# Patient Record
Sex: Female | Born: 1975 | Race: Black or African American | Hispanic: No | Marital: Single | Smoking: Never smoker
Health system: Southern US, Community
[De-identification: ages and names within clinical notes are randomized; demographics above are authoritative.]

## PROBLEM LIST (undated history)

## (undated) ENCOUNTER — Emergency Department (HOSPITAL_COMMUNITY): Admission: EM | Payer: BC Managed Care – PPO

## (undated) ENCOUNTER — Emergency Department (HOSPITAL_COMMUNITY): Admission: EM | Payer: MEDICAID | Source: Home / Self Care

## (undated) DIAGNOSIS — O24419 Gestational diabetes mellitus in pregnancy, unspecified control: Secondary | ICD-10-CM

## (undated) DIAGNOSIS — B379 Candidiasis, unspecified: Secondary | ICD-10-CM

## (undated) DIAGNOSIS — D219 Benign neoplasm of connective and other soft tissue, unspecified: Secondary | ICD-10-CM

## (undated) HISTORY — PX: ABDOMINAL HYSTERECTOMY: SHX81

---

## 1999-04-07 ENCOUNTER — Encounter: Admission: RE | Admit: 1999-04-07 | Discharge: 1999-04-07 | Payer: Self-pay | Admitting: Family Medicine

## 1999-04-20 ENCOUNTER — Encounter: Admission: RE | Admit: 1999-04-20 | Discharge: 1999-04-20 | Payer: Self-pay | Admitting: Family Medicine

## 1999-04-28 ENCOUNTER — Encounter: Payer: Self-pay | Admitting: Emergency Medicine

## 1999-04-28 ENCOUNTER — Emergency Department (HOSPITAL_COMMUNITY): Admission: EM | Admit: 1999-04-28 | Discharge: 1999-04-28 | Payer: Self-pay | Admitting: Emergency Medicine

## 1999-05-17 ENCOUNTER — Encounter: Admission: RE | Admit: 1999-05-17 | Discharge: 1999-05-17 | Payer: Self-pay | Admitting: Family Medicine

## 1999-06-28 ENCOUNTER — Encounter: Admission: RE | Admit: 1999-06-28 | Discharge: 1999-06-28 | Payer: Self-pay | Admitting: Family Medicine

## 1999-07-19 ENCOUNTER — Encounter: Admission: RE | Admit: 1999-07-19 | Discharge: 1999-07-19 | Payer: Self-pay | Admitting: Family Medicine

## 1999-08-23 ENCOUNTER — Encounter: Admission: RE | Admit: 1999-08-23 | Discharge: 1999-08-23 | Payer: Self-pay | Admitting: Family Medicine

## 1999-08-23 ENCOUNTER — Other Ambulatory Visit: Admission: RE | Admit: 1999-08-23 | Discharge: 1999-08-23 | Payer: Self-pay | Admitting: Family Medicine

## 1999-08-30 ENCOUNTER — Encounter: Admission: RE | Admit: 1999-08-30 | Discharge: 1999-08-30 | Payer: Self-pay | Admitting: Family Medicine

## 2000-03-28 ENCOUNTER — Emergency Department (HOSPITAL_COMMUNITY): Admission: EM | Admit: 2000-03-28 | Discharge: 2000-03-28 | Payer: Self-pay | Admitting: Emergency Medicine

## 2001-06-24 ENCOUNTER — Other Ambulatory Visit: Admission: RE | Admit: 2001-06-24 | Discharge: 2001-06-24 | Payer: Self-pay | Admitting: Obstetrics and Gynecology

## 2001-06-27 ENCOUNTER — Inpatient Hospital Stay (HOSPITAL_COMMUNITY): Admission: AD | Admit: 2001-06-27 | Discharge: 2001-06-27 | Payer: Self-pay | Admitting: Obstetrics and Gynecology

## 2001-12-02 ENCOUNTER — Inpatient Hospital Stay (HOSPITAL_COMMUNITY): Admission: AD | Admit: 2001-12-02 | Discharge: 2001-12-04 | Payer: Self-pay | Admitting: *Deleted

## 2001-12-05 ENCOUNTER — Encounter: Admission: RE | Admit: 2001-12-05 | Discharge: 2002-01-04 | Payer: Self-pay | Admitting: Obstetrics and Gynecology

## 2002-01-05 ENCOUNTER — Encounter: Admission: RE | Admit: 2002-01-05 | Discharge: 2002-02-04 | Payer: Self-pay | Admitting: Obstetrics and Gynecology

## 2002-01-21 ENCOUNTER — Other Ambulatory Visit: Admission: RE | Admit: 2002-01-21 | Discharge: 2002-01-21 | Payer: Self-pay | Admitting: Obstetrics and Gynecology

## 2002-03-05 ENCOUNTER — Encounter: Admission: RE | Admit: 2002-03-05 | Discharge: 2002-04-04 | Payer: Self-pay | Admitting: Obstetrics and Gynecology

## 2002-05-05 ENCOUNTER — Encounter: Admission: RE | Admit: 2002-05-05 | Discharge: 2002-06-04 | Payer: Self-pay | Admitting: Obstetrics and Gynecology

## 2002-07-05 ENCOUNTER — Encounter: Admission: RE | Admit: 2002-07-05 | Discharge: 2002-08-04 | Payer: Self-pay | Admitting: Obstetrics and Gynecology

## 2002-08-05 ENCOUNTER — Encounter: Admission: RE | Admit: 2002-08-05 | Discharge: 2002-09-04 | Payer: Self-pay | Admitting: Obstetrics and Gynecology

## 2002-10-05 ENCOUNTER — Encounter: Admission: RE | Admit: 2002-10-05 | Discharge: 2002-11-04 | Payer: Self-pay | Admitting: Obstetrics and Gynecology

## 2002-12-05 ENCOUNTER — Encounter: Admission: RE | Admit: 2002-12-05 | Discharge: 2003-01-04 | Payer: Self-pay | Admitting: Obstetrics and Gynecology

## 2003-01-05 ENCOUNTER — Encounter: Admission: RE | Admit: 2003-01-05 | Discharge: 2003-02-04 | Payer: Self-pay | Admitting: Obstetrics and Gynecology

## 2003-03-06 ENCOUNTER — Encounter: Admission: RE | Admit: 2003-03-06 | Discharge: 2003-04-05 | Payer: Self-pay | Admitting: *Deleted

## 2006-10-08 ENCOUNTER — Emergency Department (HOSPITAL_COMMUNITY): Admission: EM | Admit: 2006-10-08 | Discharge: 2006-10-08 | Payer: Self-pay | Admitting: Emergency Medicine

## 2008-07-23 ENCOUNTER — Ambulatory Visit: Payer: Self-pay | Admitting: Obstetrics and Gynecology

## 2008-07-24 ENCOUNTER — Ambulatory Visit (HOSPITAL_COMMUNITY): Admission: RE | Admit: 2008-07-24 | Discharge: 2008-07-24 | Payer: Self-pay | Admitting: Family Medicine

## 2008-08-06 ENCOUNTER — Ambulatory Visit: Payer: Self-pay | Admitting: Obstetrics & Gynecology

## 2008-09-02 ENCOUNTER — Ambulatory Visit: Payer: Self-pay | Admitting: Vascular Surgery

## 2008-09-22 ENCOUNTER — Inpatient Hospital Stay (HOSPITAL_COMMUNITY): Admission: RE | Admit: 2008-09-22 | Discharge: 2008-09-25 | Payer: Self-pay | Admitting: Obstetrics & Gynecology

## 2008-09-22 ENCOUNTER — Ambulatory Visit: Payer: Self-pay | Admitting: Obstetrics & Gynecology

## 2008-09-22 ENCOUNTER — Encounter: Payer: Self-pay | Admitting: Obstetrics & Gynecology

## 2008-10-05 ENCOUNTER — Ambulatory Visit: Payer: Self-pay | Admitting: Obstetrics & Gynecology

## 2008-10-05 ENCOUNTER — Inpatient Hospital Stay (HOSPITAL_COMMUNITY): Admission: AD | Admit: 2008-10-05 | Discharge: 2008-10-05 | Payer: Self-pay | Admitting: Obstetrics & Gynecology

## 2008-10-05 ENCOUNTER — Ambulatory Visit: Payer: Self-pay | Admitting: Obstetrics and Gynecology

## 2009-02-24 ENCOUNTER — Emergency Department (HOSPITAL_COMMUNITY): Admission: EM | Admit: 2009-02-24 | Discharge: 2009-02-24 | Payer: Self-pay | Admitting: Emergency Medicine

## 2011-03-09 LAB — POCT URINALYSIS DIP (DEVICE)
Protein, ur: 100 mg/dL — AB
Specific Gravity, Urine: 1.02 (ref 1.005–1.030)

## 2011-03-09 LAB — POCT PREGNANCY, URINE: Preg Test, Ur: NEGATIVE

## 2011-04-11 NOTE — Discharge Summary (Signed)
NAMEADLER, ALTON               ACCOUNT NO.:  [id number]   MEDICAL RECORD NO.:  [id number]          PATIENT TYPE:  INP   LOCATION:  9310                          FACILITY:  [facility]   PHYSICIAN:  Allie Bossier, MD        DATE OF BIRTH:  07-10-1976   DATE OF ADMISSION:  09/22/2008  DATE OF DISCHARGE:  09/25/2008                               DISCHARGE SUMMARY   DISPOSITION:  Home.   DIET:  As tolerated.   ACTIVITY:  Nothing per vagina for 6 weeks.  No driving a car while on  narcotics.   CONDITION:  Stable.   PROCEDURE:  Total abdominal hysterectomy.   TRANSFUSION:  Six units of packed blood cells.   ADMISSION DIAGNOSES:  Fibroids and anemia.   DISCHARGE DIAGNOSES:  Fibroids and anemia.   HISTORY OF PRESENT ILLNESS AND HOSPITAL COURSE:  Terri Gibson is a 35 year old  married, black, gravida 3, para 2, abortus 1, who has been followed in  the [facility] Clinic.  She has been having anemia and pica (she eats 1-2 boxes  a day of starch along with bread, dirt).  Her hemoglobin had finally  reached a peak of 7.5 in the clinic and ultrasound showed multiple  fibroids with her uterus having a vertical dimension of 14.5 cm.  There  were several fibroids that were each 5 cm.  She wished to have a  hysterectomy done.  Based on her preop hemoglobin done at the time in  clinic, I have ordered 4 units of blood to be typed and crossed with her  surgery.  The preop hemoglobin the day before surgery was 9.2, I am not  sure of the accuracy of the hemoglobin in light of a hemoglobin was 7.5  not in the near recent past.  In any event, her surgery was  uncomplicated, although she did lose a liter of blood because of the  large fibroids and the need to dissect them out.  Transfusion of 2 units  of packed cells in the recovery room.  The following morning, her  hemoglobin was 6.6 and she received a total of 4 more units over the  next 2 days.  By the day of discharge, her hemoglobin was 9.4 and was  stable.   She was afebrile throughout her hospital course.  She was  tolerating p.o., voiding without dysuria, ambulating without difficulty,  and ready to go home by postoperative day #3.   PHYSICAL EXAMINATION:  Her abdomen to be benign with normoactive bowel  sounds and no distention and nontender.  Her incision was clean, dry,  and intact without erythema.  Please note that she did complain of a  yeast infection on the day of discharge, but that was her only  complaint.   MEDICATIONS WHEN SHE GOES TO HOME:  1. Vicodin one p.o. q.4 h. p.r.n. pain, #40, no refills.  2. Over-the-counter ibuprofen 600 mg one p.o. q.6 h. p.r.n.  3. Diflucan 150 mg p.o. x1.  4. Benadryl 50 mg p.o. q.4 h. p.r.n. itching.  5. Claritin 10 mg a day p.r.n. intake.  She will follow up in 4-6 weeks or as necessary sooner.  This pathology  showed the uterus that measured over 600 g and also had adenomyosis and  leiomyomata and benign cervix.      Allie Bossier, MD  Electronically Signed     MCD/MEDQ  D:  10/30/2008  T:  10/30/2008  Job:  [id number]

## 2011-04-11 NOTE — Op Note (Signed)
NAMEJOHNIE, STADEL               ACCOUNT NO.:  [id number]   MEDICAL RECORD NO.:  [id number]           PATIENT TYPE:   LOCATION:                                 FACILITY:   PHYSICIAN:  Allie Bossier, MD        DATE OF BIRTH:  03-19-1976   DATE OF PROCEDURE:  DATE OF DISCHARGE:                               OPERATIVE REPORT   PREOPERATIVE DIAGNOSES:  Uterine fibroids, anemia, menorrhagia,  dyspareunia.   POSTOPERATIVE DIAGNOSES:  Uterine fibroids, anemia, menorrhagia,  dyspareunia.   PROCEDURE:  1. Total abdominal hysterectomy.  2. Partial right salpingectomy.  3. Lysis of adhesions.   SURGEON:  Myra C. Marice Potter, MD   ASSISTANT:  Javier Glazier. Okey Dupre, MD   ANESTHESIA:  Dorena Bodo Belva Agee, MD   FINDINGS:  Grossly enlarged uterus, normal-appearing ovaries,  pedunculated fibroid, absence of portion of right oviduct.   DETAILED PROCEDURE AND FINDINGS:  Risks, benefits, and alternatives of  surgery were explained to her and accepted.  Consents were signed.  In  the office, we had discussed that she would need 4 units of packed red  blood cells transfusion at the initiation of the case due to her  hemoglobin of 7; however, on the day before surgery her hemoglobin was  up to 9 and we discussed decreasing the transfusion down to 2, so we  planned to give her 2 units of packed red blood cells in the recovery  room.  She was taken to the operating room.  General anesthesia was  applied without complication.  A transverse incision was made  approximately 2 cm above the symphysis pubis.  Incision was carried down  through the subcutaneous tissue to the fascia.  The fascia was scored in  the midline.  There was a significant amount of oozing in the  subcutaneous tissue which continued throughout the case the fascial  incision was extended bilaterally and curved slightly upwards.  The  middle one-half of the rectus muscles were separated in transverse  fashion.  The peritoneum was entered with  hemostats.  Peritoneal  incision was extended with the Bovie taking care to avoid the bladder.  The bowel was packed up into the abdominal cavity with moist lap  sponges.  The 4 sidewalls of the abdomen were lined with moist lap  sponges.  A O'Connor-O'Sullivan self-retaining retractor was placed.  The ovaries were inspected and noted to be normal.  There was a 4-cm  pedunculated fibroid dangling from the right fundus of the uterus.  This  was just clamped, cut, and ligated for ease of surgery.  The utero-  ovarian ligaments were grasped bilaterally with Kocher clamps.  Please  note that 2-0 Vicryl suture was used throughout this case unless  otherwise specified.  The round ligaments were clamped, cut, and  ligated.  The utero-ovarian ligaments were clamped, cut, and doubly  ligated.  Technique was used heading for the uterine vessels.  In order  to create a bladder flap, I needed to have better exposure, therefore I  excised several 3-4 cm intramural fibroids from the right  lateral side  wall of the uterus and the anterior wall of the uterus.  This offered me  better visualization.  I created a bladder flap and pushed the bladder  safely out of the operative site.  I was able to visualize the uterine  vessels and skeletonized and clamped, cut, and doubly ligated them.  The  cervix was then taken down from its pelvic attachments with the same  clamped, cut, and ligate technique.  I was able to enter the vagina, and  the cervix was removed and noted to be intact.  The cuff was closed with  a running locking 0 Vicryl suture.  A figure-of-eight suture in the  midline yielded excellent hemostasis.  The pelvis was irrigated with a  liter of warm normal saline, and all pedicles were noted to be  hemostatic.  There was noted at the beginning of the case a bit of  omentum attached to the right ovary.  This adhesion was taken down using  0 Vicryl suture to ligate the omentum.  It then appeared that  a piece of  the oviduct was involved with the omentum.  I excised that portion  (approximately 1 cm) and sent that to pathology.  The remainder of the  omental pedicle was hemostatic as were the ovaries.  The pelvis was  again inspected and noted to be hemostatic.  The peritoneum was closed  with a 2-0 Vicryl running nonlocking suture.  The rectus fascia and  rectus muscles were inspected.  A figure-of-eight suture in the middle  of the left rectus muscle was required to yield excellent hemostasis  there.  The fascia was then closed with a 0 Vicryl running nonlocking  suture.  No defects were palpable.  The subcutaneous tissue was  irrigated, cleaned, and dried.  It was infiltrated with 30 mL of 0.5%  Marcaine.  A subcuticular closure was done with 3-0 Vicryl suture.  Steri-Strips were placed across the incision.  The Foley catheter  drained clear urine throughout.  She was extubated and taken to recovery  room in stable condition.   ESTIMATED BLOOD LOSS:  1000 mL.      Allie Bossier, MD  Electronically Signed     MCD/MEDQ  D:  09/22/2008  T:  09/23/2008  Job:  [id number]

## 2011-04-11 NOTE — H&P (Signed)
NAMEMAXIE, DEBOSE               ACCOUNT NO.:  [id number]   MEDICAL RECORD NO.:  [id number]           PATIENT TYPE:   LOCATION:                                 FACILITY:   PHYSICIAN:  Allie Bossier, MD        DATE OF BIRTH:  1976/02/12   DATE OF ADMISSION:  09/22/2008  DATE OF DISCHARGE:                              HISTORY & PHYSICAL   Ms. Lerette is a 35 year old married, black gravida 3, para 2, abortus 2  with a 51-year-old son and a 23 year old daughter.  She was seen here as  a referral from [facility] for anemia (her hemoglobin was noted to be  5 due to several causes, one of which is a 14-week size uterus as well  as pica).  She has an uncontrollable urge currently to be eating 1-2  boxes of starch a day along with [place].  She is currently seeing a  therapist for this and has been advised to take fish oil as well as  iron.  Most recently, her hemoglobin was up to 7.5.  An ultrasound was  done which showed her uterus to measure a 14.3 x 8.4 cm with several  fibroids each measuring approximately 5 cm.  She does not desire more  children and wishes to have a hysterectomy.   PAST MEDICAL HISTORY:  Fibroid uterus, anemia, pica and a goiter.  Please note that her TSH was done on July 23, 2008, it was normal at  2.45.  Obesity.   PAST SURGICAL HISTORY:  She had some surgery as a child after her thumb  became infected with the splinter.  She had a D&C after miscarriage.   REVIEW OF SYSTEMS:  She has been married for 7 years and complains of  painful intercourse.  Her husband has had a vasectomy.  She is currently  unemployed and as I mentioned earlier seeing a therapist for the pica.  Pap smear was done in June and was normal.   ALLERGIES:  No drug allergies.  No latex allergy.  She states she is  allergic to TOMATOES.   PHYSICAL EXAMINATION:  VITAL SIGNS:  A 194 pounds, height 5 foot 4,  blood pressure 114/70, and pulse 89.  HEENT:  Normal.  HEART:  Regular rate and  rhythm.  PELVIC:  External genitalia normal.  ABDOMEN:  Benign.  Her uterus is 14 weeks' size on exam.   ASSESSMENT AND PLAN:  Severe anemia with uterine fibroids and pica.  I  discussed the options and risks and she wishes to proceed with a  abdominal hysterectomy.  I will plan on transfusing her 4 units starting  with her admission to the preop area.  Risks of surgery explained and  soon accepted.  We will schedule at her convenience.  She will continue  her iron pills, and I have recommended that she discontinue the fish oil  at this time.      Allie Bossier, MD  Electronically Signed    MCD/MEDQ  D:  08/06/2008  T:  08/07/2008  Job:  [id number]

## 2011-04-11 NOTE — Discharge Summary (Signed)
Terri Gibson, Terri Gibson               ACCOUNT NO.:  [id number]   MEDICAL RECORD NO.:  [id number]          PATIENT TYPE:  INP   LOCATION:  9310                          [facility]   PHYSICIAN:  Allie Bossier, MD        DATE OF BIRTH:  Jan 20, 1976   DATE OF ADMISSION:  09/22/2008  DATE OF DISCHARGE:  09/25/2008                               DISCHARGE SUMMARY   ADMISSION DIAGNOSES:  Anemia, menorrhagia, enlarged fibroid uterus, and  PICA.   DISCHARGE DIAGNOSES:  Anemia, menorrhagia, enlarged fibroid uterus, and  PICA.   CONDITION:  Stable.   DISPOSITION:  Home.   DIET:  As tolerated.   Follow up in 4-6 weeks or as necessary sooner.    Preoperatively, her hemoglobin was up to 7.5, prior to that it had been  even lower, had discussed with her that I will plan on transfusing her 4  units at the time of her surgery.  Her surgery was relatively  uncomplicated.  She did have a liter of blood loss due to the size of  the uterus removed.  There were moderate amount of adhesions that are  needed to be taken down during the case.  In the recovery room, she  received 2 units of packed blood cells; that evening she received two  more.  By postoperative day #1, her hemoglobin was 6.3.  The following  day after her fourth unit of blood, it was up to 7.6 and after her sixth  unit of blood, on postoperative day #3, her hemoglobin was 9.4.  Throughout the hospital stay, she was afebrile and her vitals were  stable throughout.  She was voiding, ambulating, and tolerating p.o.  without difficulty.      Allie Bossier, MD  Electronically Signed     MCD/MEDQ  D:  11/12/2008  T:  11/12/2008  Job:  [id number]

## 2011-04-11 NOTE — Consult Note (Signed)
NEW PATIENT CONSULTATION   RAMISA, DUMAN  DOB:  1976/09/18                                       09/02/2008  XLKGM#:[id number]   The patient presents today for evaluation of heaviness and swelling in  both lower extremities.  She does not have any history of deep venous  thrombosis and reports an aching heavy sensation in both legs with  prolonged standing.  She does have a few scattered spider vein  telangiectasia most particularly in her left posterior popliteal fossa.  She does not have any varicose veins.   FAMILY HISTORY:  Her family history is positive only for anemia and she  has an upcoming scheduled hysterectomy.  Family history is significant  for varicosities in her mother's side of the family.   SOCIAL HISTORY:  She does not smoke or drink alcohol on a regular basis.   REVIEW OF SYSTEMS:  Her weight is reported 190 pounds.  She is 5 feet 3  inches tall.   MEDICATIONS:  Iron supplement.   PHYSICAL EXAMINATION:  General:  A well-developed, well-nourished black  female appearing stated age of 36.  Vital signs:  Blood pressure is  104/71, pulse 83, respirations 18.  Her radial and dorsalis pedis pulses  are 2+ bilaterally.  She does not have any varicose veins.  She does  have mild bilateral lower extremity edema.   She underwent screening venous duplex by me and this showed normal size  saphenous vein with no evidence of gross reflux.  I discussed this with  the patient and explained that she does not have any evidence of  significant arterial or venous pathology.  I explained to her that the  spider veins do not necessarily indicate more serious venous  complications and explained that they could be treated with  sclerotherapy if they became a concern regarding appearance.  I did  explain the potential benefit from knee high compression garments and  explained the use of these to her.  She was reassured with this  discussion and will see [place]  again on an as-needed basis.   Larina Earthly, M.D.  Electronically Signed   TFE/MEDQ  D:  09/02/2008  T:  09/03/2008  Job:  [id number]

## 2011-04-11 NOTE — Group Therapy Note (Signed)
NAMETAYGEN, NEWSOME NO.:  [id number]   MEDICAL RECORD NO.:  [id number]          PATIENT TYPE:  WOC   LOCATION:  [facility]                   FACILITY:  [facility]   PHYSICIAN:  Argentina Donovan, MD        DATE OF BIRTH:  [birth year]   DATE OF SERVICE:                                  CLINIC NOTE   The patient is a 35 year old married African American female, gravida 2,  para 2-0-0-2, who was referred from [facility] for evaluation of  fibroids.  She has had heavy periods over the past couple of months and  in questioning the patient she also has a strong pica which she started  after the birth of her last child who is age 20 where she eats starch,  dry oatmeal, when she visits [place] red dirt, and she can easily in 1  day eat 2 boxes of [place] starch.  She does not know why it started, why  she does it, but she has no other significant problems such as smoking,  drinking alcohol or taking illicit drugs.  She is a well-adjusted  patient with the exception of this.  She is in a stable marriage, her  husband just had a vasectomy, and incidentally on her examination at the  health department they found an enlarged thyroid.   EXAMINATION:  THYROID:  Seemed symmetrical although enlarged, soft, with  no nodularity or nodes noted.  ABDOMEN:  Is soft, flat, nontender.  No masses or organomegaly.  PELVIC EXAMINATION:  External genitalia is normal.  BUN is within normal  limits.  Vagina is clean and well rugated.  The cervix is clean and  parous.  The uterus seems about a 14-week size, markedly irregular and  extending laterally on both sides, somewhat tender to palpation.  The  adnexa could not be well outlined because of the size of the uterus.   The patient's other complaint at exam states that not only does she have  the heavy periods, as we told her we probably can control that bleeding  after evaluation with an ultrasound, but she also has dyspareunia over  the past year  and her abdomen gets quite sore after coitus.   The plan is to get an ultrasound, evaluate the thyroid TSH, T4 and T3  to begin with.  I am going to send her to behavioral health to see what  treatment may be given for the pica.   IMPRESSION:  Symptomatic leiomyomata uteri, severe anemia with a  hemoglobin at the [facility] of 5 up to a hemoglobin of 7.5 with  hematocrit of 25.4.  The patient has been given a prescription in the  past for iron but does not take it because it constipates her so much.  I am going to give her a prescription for Slow FE and see if she can  tolerate that.  Ultrasound to evaluate the uterus.  Impression is  symptomatic uterine fibroids, pica with severe anemia, menorrhagia,  dyspareunia and enlarged thyroid.  The patient will return in 2 weeks.  ______________________________  Argentina Donovan, MD     PR/MEDQ  D:  07/23/2008  T:  07/23/2008  Job:  [id number]

## 2011-08-28 LAB — PREPARE RBC (CROSSMATCH)

## 2011-08-28 LAB — CBC
HCT: 20.8 — ABNORMAL LOW
HCT: 30 — ABNORMAL LOW
Hemoglobin: 6.3 — CL
Hemoglobin: 6.6 — CL
Hemoglobin: 9.2 — ABNORMAL LOW
Hemoglobin: 9.4 — ABNORMAL LOW
MCHC: 30.8
MCHC: 31.6
MCHC: 32.5
MCHC: 33.2
MCV: 79.1
MCV: 80.7
Platelets: 143 — ABNORMAL LOW
Platelets: 123 — ABNORMAL LOW
RBC: 2.85 — ABNORMAL LOW
RBC: 3.25 — ABNORMAL LOW
RDW: 24 — ABNORMAL HIGH
RDW: 25.8 — ABNORMAL HIGH
RDW: 26.1 — ABNORMAL HIGH
RDW: 20.3 — ABNORMAL HIGH
WBC: 11.6 — ABNORMAL HIGH
WBC: 15.2 — ABNORMAL HIGH

## 2011-08-28 LAB — FIBRINOGEN: Fibrinogen: 354

## 2011-08-28 LAB — CROSSMATCH

## 2011-08-28 LAB — APTT: aPTT: 32

## 2011-08-28 LAB — PROTIME-INR: INR: 1.1

## 2011-08-28 LAB — ABO/RH: ABO/RH(D): O POS

## 2011-08-29 LAB — URINALYSIS, ROUTINE W REFLEX MICROSCOPIC
Leukocytes, UA: NEGATIVE
Nitrite: NEGATIVE
Protein, ur: NEGATIVE
Specific Gravity, Urine: 1.01
Urobilinogen, UA: 0.2

## 2011-08-29 LAB — POCT URINALYSIS DIP (DEVICE)
Bilirubin Urine: NEGATIVE
Glucose, UA: NEGATIVE
Nitrite: NEGATIVE
Operator id: 297281
Urobilinogen, UA: 0.2

## 2011-08-29 LAB — URINE MICROSCOPIC-ADD ON

## 2011-08-29 LAB — CBC
MCHC: 32
MCV: 86.6
RBC: 4.38
RDW: 23.7 — ABNORMAL HIGH

## 2014-05-03 ENCOUNTER — Emergency Department (HOSPITAL_COMMUNITY)
Admission: EM | Admit: 2014-05-03 | Discharge: 2014-05-03 | Disposition: A | Discharge status: Home / Self Care | Payer: Medicaid Other | Attending: Emergency Medicine | Admitting: Emergency Medicine

## 2014-05-03 ENCOUNTER — Emergency Department (HOSPITAL_COMMUNITY): Payer: Medicaid Other

## 2014-05-03 ENCOUNTER — Encounter (HOSPITAL_COMMUNITY): Payer: Self-pay | Admitting: Emergency Medicine

## 2014-05-03 DIAGNOSIS — R350 Frequency of micturition: Secondary | ICD-10-CM | POA: Insufficient documentation

## 2014-05-03 DIAGNOSIS — Z79899 Other long term (current) drug therapy: Secondary | ICD-10-CM | POA: Diagnosis not present

## 2014-05-03 DIAGNOSIS — Z3202 Encounter for pregnancy test, result negative: Secondary | ICD-10-CM | POA: Insufficient documentation

## 2014-05-03 DIAGNOSIS — N83209 Unspecified ovarian cyst, unspecified side: Secondary | ICD-10-CM | POA: Insufficient documentation

## 2014-05-03 DIAGNOSIS — Z9089 Acquired absence of other organs: Secondary | ICD-10-CM | POA: Insufficient documentation

## 2014-05-03 DIAGNOSIS — R197 Diarrhea, unspecified: Secondary | ICD-10-CM | POA: Diagnosis not present

## 2014-05-03 DIAGNOSIS — D72829 Elevated white blood cell count, unspecified: Secondary | ICD-10-CM | POA: Insufficient documentation

## 2014-05-03 DIAGNOSIS — R109 Unspecified abdominal pain: Secondary | ICD-10-CM | POA: Diagnosis present

## 2014-05-03 LAB — WET PREP, GENITAL
Clue Cells Wet Prep HPF POC: NONE SEEN
Trich, Wet Prep: NONE SEEN
WBC WET PREP: NONE SEEN
Yeast Wet Prep HPF POC: NONE SEEN

## 2014-05-03 LAB — COMPREHENSIVE METABOLIC PANEL
ALBUMIN: 4.1 g/dL (ref 3.5–5.2)
ALT: 11 U/L (ref 0–35)
AST: 15 U/L (ref 0–37)
Alkaline Phosphatase: 60 U/L (ref 39–117)
BILIRUBIN TOTAL: 0.3 mg/dL (ref 0.3–1.2)
BUN: 13 mg/dL (ref 6–23)
CHLORIDE: 104 meq/L (ref 96–112)
CO2: 24 meq/L (ref 19–32)
CREATININE: 0.99 mg/dL (ref 0.50–1.10)
Calcium: 9.9 mg/dL (ref 8.4–10.5)
GFR, EST AFRICAN AMERICAN: 83 mL/min — AB (ref >90)
GFR, EST NON AFRICAN AMERICAN: 71 mL/min — AB (ref >90)
GLUCOSE: 89 mg/dL (ref 70–99)
Potassium: 3.8 mEq/L (ref 3.7–5.3)
Sodium: 140 mEq/L (ref 137–147)
Total Protein: 7.3 g/dL (ref 6.0–8.3)

## 2014-05-03 LAB — CBC WITH DIFFERENTIAL/PLATELET
BASOS PCT: 0 % (ref 0–1)
Basophils Absolute: 0 10*3/uL (ref 0.0–0.1)
Eosinophils Absolute: 0.1 10*3/uL (ref 0.0–0.7)
Eosinophils Relative: 1 % (ref 0–5)
HEMATOCRIT: 39.8 % (ref 36.0–46.0)
HEMOGLOBIN: 13.3 g/dL (ref 12.0–15.0)
Lymphocytes Relative: 31 % (ref 12–46)
Lymphs Abs: 3.5 10*3/uL (ref 0.7–4.0)
MCH: 28.5 pg (ref 26.0–34.0)
MCHC: 33.4 g/dL (ref 30.0–36.0)
MCV: 85.2 fL (ref 78.0–100.0)
MONO ABS: 0.9 10*3/uL (ref 0.1–1.0)
MONOS PCT: 8 % (ref 3–12)
NEUTROS ABS: 6.7 10*3/uL (ref 1.7–7.7)
Neutrophils Relative %: 60 % (ref 43–77)
Platelets: 188 10*3/uL (ref 150–400)
RBC: 4.67 MIL/uL (ref 3.87–5.11)
RDW: 13.5 % (ref 11.5–15.5)
WBC: 11.1 10*3/uL — ABNORMAL HIGH (ref 4.0–10.5)

## 2014-05-03 LAB — URINALYSIS, ROUTINE W REFLEX MICROSCOPIC
Bilirubin Urine: NEGATIVE
Glucose, UA: NEGATIVE mg/dL
KETONES UR: NEGATIVE mg/dL
LEUKOCYTES UA: NEGATIVE
NITRITE: NEGATIVE
PROTEIN: NEGATIVE mg/dL
Specific Gravity, Urine: 1.021 (ref 1.005–1.030)
UROBILINOGEN UA: 0.2 mg/dL (ref 0.0–1.0)
pH: 7 (ref 5.0–8.0)

## 2014-05-03 LAB — LIPASE, BLOOD: LIPASE: 28 U/L (ref 11–59)

## 2014-05-03 LAB — URINE MICROSCOPIC-ADD ON

## 2014-05-03 LAB — PREGNANCY, URINE: PREG TEST UR: NEGATIVE

## 2014-05-03 MED ORDER — KETOROLAC TROMETHAMINE 30 MG/ML IJ SOLN: 30.0000 mg | Freq: Once | INTRAMUSCULAR | Status: DC

## 2014-05-03 MED ORDER — SODIUM CHLORIDE 0.9 % IV BOLUS (SEPSIS)
1000.0000 mL | Freq: Once | INTRAVENOUS | Status: AC
  Administered 2014-05-03: 1000 mL via INTRAVENOUS

## 2014-05-03 MED ORDER — ONDANSETRON HCL 4 MG/2ML IJ SOLN
4.0000 mg | Freq: Once | INTRAMUSCULAR | Status: AC
Start: 2014-05-03 — End: 2014-05-03
  Administered 2014-05-03: 4 mg via INTRAVENOUS
  Filled 2014-05-03: qty 2

## 2014-05-03 MED ORDER — MORPHINE SULFATE 4 MG/ML IJ SOLN: 4.0000 mg | Freq: Once | INTRAMUSCULAR | Status: DC

## 2014-05-03 MED ORDER — IBUPROFEN 800 MG PO TABS: 800.0000 mg | ORAL_TABLET | Freq: Three times a day (TID) | ORAL | Status: DC

## 2014-05-03 MED ORDER — KETOROLAC TROMETHAMINE 30 MG/ML IJ SOLN
30.0000 mg | Freq: Once | INTRAMUSCULAR | Status: AC
  Administered 2014-05-03: 30 mg via INTRAVENOUS
  Filled 2014-05-03: qty 1

## 2014-05-03 NOTE — ED Provider Notes (Signed)
CSN: [id number]     Arrival date & time 05/03/14  1210 History   First MD Initiated Contact with Patient 05/03/14 1219     Chief Complaint  Patient presents with  . Abdominal Pain     (Consider location/radiation/quality/duration/timing/severity/associated sxs/prior Treatment) The history is provided by the patient. No language interpreter was used.  Terri Gibson is a 38 year old female with noagain past medical history-abdominal hysterectomy, partial in 1995 presenting to the ED with abdominal pain and urinary frequency. As per patient, reported that the urinary frequency started approximately 5 days ago -reported that the first 2 days she noticed hematuria but is now resolved. Reported that earlier this morning patient started to experience lower abdominal pain described as a pressure, contraction discomfort that is intermittent. Stated that with urination she feels pressure. Reported intermittent episodes of diarrhea throughout today. Stated that she has been feeling mildly nauseous. Reported feeling lightheaded. Denied radiation of pain. Stated that she is taking Goody powders with minimal relief. Reported that she felt fine yesterday- ate Friday pizza. Denied back pain, dysuria, flank pain, melena, hematochezia, vomiting, vaginal bleeding, vaginal discharge, vaginal pain, chest pain, shortness of breath, difficulty breathing, headache, dizziness, changes eating habits. PCP none  History reviewed. No pertinent past medical history. Past Surgical History  Procedure Laterality Date  . Abdominal hysterectomy     No family history on file. History  Substance Use Topics  . Smoking status: Never Smoker   . Smokeless tobacco: Not on file  . Alcohol Use: No   OB History   Grav Para Term Preterm Abortions TAB SAB Ect Mult Living                 Review of Systems  Constitutional: Negative for fever and chills.  Respiratory: Negative for chest tightness and shortness of breath.     Cardiovascular: Negative for chest pain.  Gastrointestinal: Positive for nausea, abdominal pain and diarrhea. Negative for vomiting, constipation, blood in stool and anal bleeding.  Genitourinary: Positive for urgency and hematuria. Negative for dysuria, flank pain, decreased urine volume, vaginal bleeding, vaginal discharge, vaginal pain and pelvic pain.  Musculoskeletal: Negative for back pain, neck pain and neck stiffness.  Neurological: Positive for light-headedness. Negative for weakness and numbness.      Allergies  Rabbit protein and Tomato  Home Medications   Prior to Admission medications   Medication Sig Start Date End Date Taking? Authorizing Provider  phentermine 15 MG capsule Take 15 mg by mouth daily after breakfast.   Yes Historical Provider, MD   BP 125/80  Pulse 70  Temp(Src) 98.2 F (36.8 C) (Oral)  Resp 18  SpO2 98% Physical Exam  Nursing note and vitals reviewed. Constitutional: She is oriented to person, place, and time. She appears well-developed and well-nourished. No distress.  HENT:  Head: Normocephalic and atraumatic.  Mouth/Throat: Oropharynx is clear and moist. No oropharyngeal exudate.  Eyes: Conjunctivae and EOM are normal. Pupils are equal, round, and reactive to light. Right eye exhibits no discharge. Left eye exhibits no discharge.  Neck: Normal range of motion. Neck supple. No tracheal deviation present.  Cardiovascular: Normal rate, regular rhythm and normal heart sounds.  Exam reveals no friction rub.   No murmur heard. Pulses:      Radial pulses are 2+ on the right side, and 2+ on the left side.       Dorsalis pedis pulses are 2+ on the right side, and 2+ on the left side.  Pulmonary/Chest: Effort normal  and breath sounds normal. No respiratory distress. She has no wheezes. She has no rales.  Abdominal: Soft. Bowel sounds are normal. She exhibits no distension. There is tenderness in the right lower quadrant, suprapubic area and left lower  quadrant. There is no rebound and no guarding.    Negative abdominal distention Bowel sounds normoactive in all 4 quadrants Discomfort upon palpation to the lower quadrants of the abdomen pelvis suprapubic region Negative peritoneal signs Negative guarding or rigidity noted Negative CVA tenderness bilaterally  Genitourinary:  Pelvic exam: Negative swelling, erythema, inflammation, lesions, sores, deformities identified to the external genitalia. Negative swelling, erythema, pronation, masses, lesions noted to the vaginal canal. Negative blood in the vaginal vault. Negative vaginal discharge noted. Cervix identified with negative friability, erythema, formation. Mild CMT. Bilateral adnexal tenderness with suprapubic discomfort. Chaperoned with tech  Musculoskeletal: Normal range of motion.  Full ROM to upper and lower extremities without difficulty noted, negative ataxia noted.  Lymphadenopathy:    She has no cervical adenopathy.  Neurological: She is alert and oriented to person, place, and time. No cranial nerve deficit. She exhibits normal muscle tone. Coordination normal.  Skin: Skin is warm and dry. No rash noted. She is not diaphoretic. No erythema.  Psychiatric: She has a normal mood and affect. Her behavior is normal. Thought content normal.    ED Course  Procedures (including critical care time)  Results for orders placed during the hospital encounter of 05/03/14  WET PREP, GENITAL      Result Value Ref Range   Yeast Wet Prep HPF POC NONE SEEN  NONE SEEN   Trich, Wet Prep NONE SEEN  NONE SEEN   Clue Cells Wet Prep HPF POC NONE SEEN  NONE SEEN   WBC, Wet Prep HPF POC NONE SEEN  NONE SEEN  CBC WITH DIFFERENTIAL      Result Value Ref Range   WBC 11.1 (*) 4.0 - 10.5 K/uL   RBC 4.67  3.87 - 5.11 MIL/uL   Hemoglobin 13.3  12.0 - 15.0 g/dL   HCT 39.8  36.0 - 46.0 %   MCV 85.2  78.0 - 100.0 fL   MCH 28.5  26.0 - 34.0 pg   MCHC 33.4  30.0 - 36.0 g/dL   RDW 13.5  11.5 - 15.5 %     Platelets 188  150 - 400 K/uL   Neutrophils Relative % 60  43 - 77 %   Neutro Abs 6.7  1.7 - 7.7 K/uL   Lymphocytes Relative 31  12 - 46 %   Lymphs Abs 3.5  0.7 - 4.0 K/uL   Monocytes Relative 8  3 - 12 %   Monocytes Absolute 0.9  0.1 - 1.0 K/uL   Eosinophils Relative 1  0 - 5 %   Eosinophils Absolute 0.1  0.0 - 0.7 K/uL   Basophils Relative 0  0 - 1 %   Basophils Absolute 0.0  0.0 - 0.1 K/uL  COMPREHENSIVE METABOLIC PANEL      Result Value Ref Range   Sodium 140  137 - 147 mEq/L   Potassium 3.8  3.7 - 5.3 mEq/L   Chloride 104  96 - 112 mEq/L   CO2 24  19 - 32 mEq/L   Glucose, Bld 89  70 - 99 mg/dL   BUN 13  6 - 23 mg/dL   Creatinine, Ser 0.99  0.50 - 1.10 mg/dL   Calcium 9.9  8.4 - 10.5 mg/dL   Total Protein 7.3  6.0 -  8.3 g/dL   Albumin 4.1  3.5 - 5.2 g/dL   AST 15  0 - 37 U/L   ALT 11  0 - 35 U/L   Alkaline Phosphatase 60  39 - 117 U/L   Total Bilirubin 0.3  0.3 - 1.2 mg/dL   GFR calc non Af Amer 71 (*) >90 mL/min   GFR calc Af Amer 83 (*) >90 mL/min  LIPASE, BLOOD      Result Value Ref Range   Lipase 28  11 - 59 U/L  URINALYSIS, ROUTINE W REFLEX MICROSCOPIC      Result Value Ref Range   Color, Urine YELLOW  YELLOW   APPearance CLOUDY (*) CLEAR   Specific Gravity, Urine 1.021  1.005 - 1.030   pH 7.0  5.0 - 8.0   Glucose, UA NEGATIVE  NEGATIVE mg/dL   Hgb urine dipstick LARGE (*) NEGATIVE   Bilirubin Urine NEGATIVE  NEGATIVE   Ketones, ur NEGATIVE  NEGATIVE mg/dL   Protein, ur NEGATIVE  NEGATIVE mg/dL   Urobilinogen, UA 0.2  0.0 - 1.0 mg/dL   Nitrite NEGATIVE  NEGATIVE   Leukocytes, UA NEGATIVE  NEGATIVE  URINE MICROSCOPIC-ADD ON      Result Value Ref Range   RBC / HPF TOO NUMEROUS TO COUNT  <3 RBC/hpf   Bacteria, UA RARE  RARE   Urine-Other AMORPHOUS URATES/PHOSPHATES    PREGNANCY, URINE      Result Value Ref Range   Preg Test, Ur NEGATIVE  NEGATIVE    Labs Review Labs Reviewed  CBC WITH DIFFERENTIAL - Abnormal; Notable for the following:    WBC 11.1  (*)    All other components within normal limits  COMPREHENSIVE METABOLIC PANEL - Abnormal; Notable for the following:    GFR calc non Af Amer 71 (*)    GFR calc Af Amer 83 (*)    All other components within normal limits  URINALYSIS, ROUTINE W REFLEX MICROSCOPIC - Abnormal; Notable for the following:    APPearance CLOUDY (*)    Hgb urine dipstick LARGE (*)    All other components within normal limits  WET PREP, GENITAL  GC/CHLAMYDIA PROBE AMP  LIPASE, BLOOD  URINE MICROSCOPIC-ADD ON  PREGNANCY, URINE  POC URINE PREG, ED    Imaging Review US Pelvis Complete  05/03/2014   CLINICAL DATA:  Pelvic pain.  Prior hysterectomy.  EXAM: TRANSABDOMINAL AND TRANSVAGINAL ULTRASOUND OF PELVIS  DOPPLER ULTRASOUND OF OVARIES  TECHNIQUE: Both transabdominal and transvaginal ultrasound examinations of the pelvis were performed. Transabdominal technique was performed for global imaging of the pelvis including uterus, ovaries, adnexal regions, and pelvic cul-de-sac.  It was necessary to proceed with endovaginal exam following the transabdominal exam to visualize the endometrial stripe and left ovarian lesion. Color and duplex Doppler ultrasound was utilized to evaluate blood flow to the ovaries.  COMPARISON:  None.  FINDINGS: Uterus  Measurements: Surgically absent. Vaginal cuff is unremarkable in appearance .  Endometrium  Thickness: Not applicable.  Right ovary  Measurements: 3.2 x 2.0 x 2.4 cm. Normal appearance/no adnexal mass.  Left ovary  Measurements: 6.1 x 4.8 x 6.3 cm. A complex cyst containing solid material with convex margins is seen which measures 4.4 x 3.7 x 5.4 cm. This lesion shows no internal blood flow within the solid material, although there is blood flow in surrounding ovarian tissue. This has features most consistent with a hemorrhagic cyst. A small amount of complex free fluid is also seen in the left adnexa  Pulsed Doppler evaluation of both ovaries demonstrates both low-resistance arterial  and venous waveforms.  IMPRESSION: Probable benign hemorrhagic cyst of the left ovary measuring 5.4 cm, with mild free fluid in left adnexa. Followup by pelvic ultrasound recommended in 6-12 weeks to confirm resolution.  No definite sonographic evidence for ovarian torsion.  Previous hysterectomy.   Electronically Signed   By: Earle Gell M.D.   On: 05/03/2014 16:32   [place] Art/ven Flow Abd Pelv Doppler  05/03/2014   CLINICAL DATA:  Pelvic pain.  Prior hysterectomy.  EXAM: TRANSABDOMINAL AND TRANSVAGINAL ULTRASOUND OF PELVIS  DOPPLER ULTRASOUND OF OVARIES  TECHNIQUE: Both transabdominal and transvaginal ultrasound examinations of the pelvis were performed. Transabdominal technique was performed for global imaging of the pelvis including uterus, ovaries, adnexal regions, and pelvic cul-de-sac.  It was necessary to proceed with endovaginal exam following the transabdominal exam to visualize the endometrial stripe and left ovarian lesion. Color and duplex Doppler ultrasound was utilized to evaluate blood flow to the ovaries.  COMPARISON:  None.  FINDINGS: Uterus  Measurements: Surgically absent. Vaginal cuff is unremarkable in appearance .  Endometrium  Thickness: Not applicable.  Right ovary  Measurements: 3.2 x 2.0 x 2.4 cm. Normal appearance/no adnexal mass.  Left ovary  Measurements: 6.1 x 4.8 x 6.3 cm. A complex cyst containing solid material with convex margins is seen which measures 4.4 x 3.7 x 5.4 cm. This lesion shows no internal blood flow within the solid material, although there is blood flow in surrounding ovarian tissue. This has features most consistent with a hemorrhagic cyst. A small amount of complex free fluid is also seen in the left adnexa  Pulsed Doppler evaluation of both ovaries demonstrates both low-resistance arterial and venous waveforms.  IMPRESSION: Probable benign hemorrhagic cyst of the left ovary measuring 5.4 cm, with mild free fluid in left adnexa. Followup by pelvic ultrasound  recommended in 6-12 weeks to confirm resolution.  No definite sonographic evidence for ovarian torsion.  Previous hysterectomy.   Electronically Signed   By: Earle Gell M.D.   On: 05/03/2014 16:32   [place] Art/ven Flow Abd Pelv Doppler  05/03/2014   CLINICAL DATA:  Pelvic pain.  Prior hysterectomy.  EXAM: TRANSABDOMINAL AND TRANSVAGINAL ULTRASOUND OF PELVIS  DOPPLER ULTRASOUND OF OVARIES  TECHNIQUE: Both transabdominal and transvaginal ultrasound examinations of the pelvis were performed. Transabdominal technique was performed for global imaging of the pelvis including uterus, ovaries, adnexal regions, and pelvic cul-de-sac.  It was necessary to proceed with endovaginal exam following the transabdominal exam to visualize the endometrial stripe and left ovarian lesion. Color and duplex Doppler ultrasound was utilized to evaluate blood flow to the ovaries.  COMPARISON:  None.  FINDINGS: Uterus  Measurements: Surgically absent. Vaginal cuff is unremarkable in appearance .  Endometrium  Thickness: Not applicable.  Right ovary  Measurements: 3.2 x 2.0 x 2.4 cm. Normal appearance/no adnexal mass.  Left ovary  Measurements: 6.1 x 4.8 x 6.3 cm. A complex cyst containing solid material with convex margins is seen which measures 4.4 x 3.7 x 5.4 cm. This lesion shows no internal blood flow within the solid material, although there is blood flow in surrounding ovarian tissue. This has features most consistent with a hemorrhagic cyst. A small amount of complex free fluid is also seen in the left adnexa  Pulsed Doppler evaluation of both ovaries demonstrates both low-resistance arterial and venous waveforms.  IMPRESSION: Probable benign hemorrhagic cyst of the left ovary measuring 5.4 cm, with mild free fluid  in left adnexa. Followup by pelvic ultrasound recommended in 6-12 weeks to confirm resolution.  No definite sonographic evidence for ovarian torsion.  Previous hysterectomy.   Electronically Signed   By: Earle Gell M.D.    On: 05/03/2014 16:32     EKG Interpretation None      MDM   Final diagnoses:  None    Medications  sodium chloride 0.9 % bolus 1,000 mL (0 mLs Intravenous Stopped 05/03/14 1613)  ondansetron (ZOFRAN) injection 4 mg (4 mg Intravenous Given 05/03/14 1352)   Filed Vitals:   05/03/14 1217  BP: 125/80  Pulse: 70  Temp: 98.2 F (36.8 C)  TempSrc: Oral  Resp: 18  SpO2: 98%   CBC noted elevated white blood cell count of 11.1-negative left shift or leukocytosis noted. CMP unremarkable-kidney and liver functioning well. Electrolytes properly balanced. Lipase negative elevation. Urinalysis noted large hemoglobin-negative nitrites leukocytes identified. Urine pregnancy negative. Wet prep negative for infections. [place] noted benign hemorrhagic cyst of the left ovary measuring 5.4 cm with mild free fluid in the left adnexa - recommended repeat US in 6-12 weeks. Negative findings of ovarian torsion.  Doubt ectopic pregnancy. Doubt TOA. Doubt ovarian torsion. Doubt UTI. Doubt pyelonephritis. CT abdomen and pelvis without contrast pending. Discussed case with Abigail Butts, PA-C. Transfer of care to [facility], PA-C at change in shift.   Jamse Mead, PA-C 05/04/14 1147

## 2014-05-03 NOTE — ED Provider Notes (Signed)
Terri Gibson is a 38 y.o. female presents with increased urinary frequency, heamturia and lower abd cramping.  Pt with Hx of partial hysterectomy.  Assoc with nausea, lightheadedness.  Pt with soft and exam with CMT and bialteral adnexal tenderness on pelvic; no discharge in vaginal vault  Plan:  US pelvis and CT abd without contrast pending to r/o torsion and nephrolithiasis.    Face to face Exam:   General: Awake  HEENT: Atraumatic  Resp: Normal effort  Abd: Nondistended, soft and nontender  Neuro:No focal weakness  Lymph: No adenopathy  BP 125/80  Pulse 70  Temp(Src) 98.2 F (36.8 C) (Oral)  Resp 18  SpO2 98%  6:49 PM   Results for orders placed during the hospital encounter of 05/03/14  WET PREP, GENITAL      Result Value Ref Range   Yeast Wet Prep HPF POC NONE SEEN  NONE SEEN   Trich, Wet Prep NONE SEEN  NONE SEEN   Clue Cells Wet Prep HPF POC NONE SEEN  NONE SEEN   WBC, Wet Prep HPF POC NONE SEEN  NONE SEEN  CBC WITH DIFFERENTIAL      Result Value Ref Range   WBC 11.1 (*) 4.0 - 10.5 K/uL   RBC 4.67  3.87 - 5.11 MIL/uL   Hemoglobin 13.3  12.0 - 15.0 g/dL   HCT 39.8  36.0 - 46.0 %   MCV 85.2  78.0 - 100.0 fL   MCH 28.5  26.0 - 34.0 pg   MCHC 33.4  30.0 - 36.0 g/dL   RDW 13.5  11.5 - 15.5 %   Platelets 188  150 - 400 K/uL   Neutrophils Relative % 60  43 - 77 %   Neutro Abs 6.7  1.7 - 7.7 K/uL   Lymphocytes Relative 31  12 - 46 %   Lymphs Abs 3.5  0.7 - 4.0 K/uL   Monocytes Relative 8  3 - 12 %   Monocytes Absolute 0.9  0.1 - 1.0 K/uL   Eosinophils Relative 1  0 - 5 %   Eosinophils Absolute 0.1  0.0 - 0.7 K/uL   Basophils Relative 0  0 - 1 %   Basophils Absolute 0.0  0.0 - 0.1 K/uL  COMPREHENSIVE METABOLIC PANEL      Result Value Ref Range   Sodium 140  137 - 147 mEq/L   Potassium 3.8  3.7 - 5.3 mEq/L   Chloride 104  96 - 112 mEq/L   CO2 24  19 - 32 mEq/L   Glucose, Bld 89  70 - 99 mg/dL   BUN 13  6 - 23 mg/dL   Creatinine, Ser 0.99  0.50 - 1.10  mg/dL   Calcium 9.9  8.4 - 10.5 mg/dL   Total Protein 7.3  6.0 - 8.3 g/dL   Albumin 4.1  3.5 - 5.2 g/dL   AST 15  0 - 37 U/L   ALT 11  0 - 35 U/L   Alkaline Phosphatase 60  39 - 117 U/L   Total Bilirubin 0.3  0.3 - 1.2 mg/dL   GFR calc non Af Amer 71 (*) >90 mL/min   GFR calc Af Amer 83 (*) >90 mL/min  LIPASE, BLOOD      Result Value Ref Range   Lipase 28  11 - 59 U/L  URINALYSIS, ROUTINE W REFLEX MICROSCOPIC      Result Value Ref Range   Color, Urine YELLOW  YELLOW   APPearance CLOUDY (*)  CLEAR   Specific Gravity, Urine 1.021  1.005 - 1.030   pH 7.0  5.0 - 8.0   Glucose, UA NEGATIVE  NEGATIVE mg/dL   Hgb urine dipstick LARGE (*) NEGATIVE   Bilirubin Urine NEGATIVE  NEGATIVE   Ketones, ur NEGATIVE  NEGATIVE mg/dL   Protein, ur NEGATIVE  NEGATIVE mg/dL   Urobilinogen, UA 0.2  0.0 - 1.0 mg/dL   Nitrite NEGATIVE  NEGATIVE   Leukocytes, UA NEGATIVE  NEGATIVE  URINE MICROSCOPIC-ADD ON      Result Value Ref Range   RBC / HPF TOO NUMEROUS TO COUNT  <3 RBC/hpf   Bacteria, UA RARE  RARE   Urine-Other AMORPHOUS URATES/PHOSPHATES    PREGNANCY, URINE      Result Value Ref Range   Preg Test, Ur NEGATIVE  NEGATIVE   Ct Abdomen Pelvis Wo Contrast  05/03/2014   CLINICAL DATA:  Dysuria.  Lower abdominal and pelvic pain.  EXAM: CT ABDOMEN AND PELVIS WITHOUT CONTRAST  TECHNIQUE: Multidetector CT imaging of the abdomen and pelvis was performed following the standard protocol without IV contrast.  COMPARISON:  None.  FINDINGS: A 3 mm calculus is seen in the proximal right ureter, and a 4 mm calculus is seen in the area of the distal right ureter at the ureterovesical junction. There is mild right hydroureteronephrosis and perinephric stranding. Probable tiny 1 mm nonobstructive calculus in the upper pole of the left kidney on image 26. No evidence of left ureteral calculi or hydronephrosis.  A 5.7 cm cystic lesion is seen in the left adnexa with high attenuation, most likely representing a hemorrhagic  ovarian cyst. No other masses or lymphadenopathy identified within the abdomen or pelvis.  Small fluid attenuation hepatic cysts noted, but no definite liver masses are seen on this noncontrast study. The gallbladder, pancreas, spleen, adrenal glands are normal in appearance. Unopacified bowel loops are unremarkable in appearance.  IMPRESSION: Mild right hydronephrosis with 3 mm calculus in the proximal right ureter and 4 mm calculus at the right ureterovesical junction.  5.7 cm cystic lesion in left adnexa, likely representing a hemorrhagic ovarian cyst. See separate pelvic ultrasound report from same day.   Electronically Signed   By: Earle Gell M.D.   On: 05/03/2014 17:05   US Pelvis Complete  05/03/2014   CLINICAL DATA:  Pelvic pain.  Prior hysterectomy.  EXAM: TRANSABDOMINAL AND TRANSVAGINAL ULTRASOUND OF PELVIS  DOPPLER ULTRASOUND OF OVARIES  TECHNIQUE: Both transabdominal and transvaginal ultrasound examinations of the pelvis were performed. Transabdominal technique was performed for global imaging of the pelvis including uterus, ovaries, adnexal regions, and pelvic cul-de-sac.  It was necessary to proceed with endovaginal exam following the transabdominal exam to visualize the endometrial stripe and left ovarian lesion. Color and duplex Doppler ultrasound was utilized to evaluate blood flow to the ovaries.  COMPARISON:  None.  FINDINGS: Uterus  Measurements: Surgically absent. Vaginal cuff is unremarkable in appearance .  Endometrium  Thickness: Not applicable.  Right ovary  Measurements: 3.2 x 2.0 x 2.4 cm. Normal appearance/no adnexal mass.  Left ovary  Measurements: 6.1 x 4.8 x 6.3 cm. A complex cyst containing solid material with convex margins is seen which measures 4.4 x 3.7 x 5.4 cm. This lesion shows no internal blood flow within the solid material, although there is blood flow in surrounding ovarian tissue. This has features most consistent with a hemorrhagic cyst. A small amount of complex free  fluid is also seen in the left adnexa  Pulsed Doppler  evaluation of both ovaries demonstrates both low-resistance arterial and venous waveforms.  IMPRESSION: Probable benign hemorrhagic cyst of the left ovary measuring 5.4 cm, with mild free fluid in left adnexa. Followup by pelvic ultrasound recommended in 6-12 weeks to confirm resolution.  No definite sonographic evidence for ovarian torsion.  Previous hysterectomy.   Electronically Signed   By: Earle Gell M.D.   On: 05/03/2014 16:32   [place] Art/ven Flow Abd Pelv Doppler  05/03/2014   CLINICAL DATA:  Pelvic pain.  Prior hysterectomy.  EXAM: TRANSABDOMINAL AND TRANSVAGINAL ULTRASOUND OF PELVIS  DOPPLER ULTRASOUND OF OVARIES  TECHNIQUE: Both transabdominal and transvaginal ultrasound examinations of the pelvis were performed. Transabdominal technique was performed for global imaging of the pelvis including uterus, ovaries, adnexal regions, and pelvic cul-de-sac.  It was necessary to proceed with endovaginal exam following the transabdominal exam to visualize the endometrial stripe and left ovarian lesion. Color and duplex Doppler ultrasound was utilized to evaluate blood flow to the ovaries.  COMPARISON:  None.  FINDINGS: Uterus  Measurements: Surgically absent. Vaginal cuff is unremarkable in appearance .  Endometrium  Thickness: Not applicable.  Right ovary  Measurements: 3.2 x 2.0 x 2.4 cm. Normal appearance/no adnexal mass.  Left ovary  Measurements: 6.1 x 4.8 x 6.3 cm. A complex cyst containing solid material with convex margins is seen which measures 4.4 x 3.7 x 5.4 cm. This lesion shows no internal blood flow within the solid material, although there is blood flow in surrounding ovarian tissue. This has features most consistent with a hemorrhagic cyst. A small amount of complex free fluid is also seen in the left adnexa  Pulsed Doppler evaluation of both ovaries demonstrates both low-resistance arterial and venous waveforms.  IMPRESSION: Probable benign  hemorrhagic cyst of the left ovary measuring 5.4 cm, with mild free fluid in left adnexa. Followup by pelvic ultrasound recommended in 6-12 weeks to confirm resolution.  No definite sonographic evidence for ovarian torsion.  Previous hysterectomy.   Electronically Signed   By: Earle Gell M.D.   On: 05/03/2014 16:32   [place] Art/ven Flow Abd Pelv Doppler  05/03/2014   CLINICAL DATA:  Pelvic pain.  Prior hysterectomy.  EXAM: TRANSABDOMINAL AND TRANSVAGINAL ULTRASOUND OF PELVIS  DOPPLER ULTRASOUND OF OVARIES  TECHNIQUE: Both transabdominal and transvaginal ultrasound examinations of the pelvis were performed. Transabdominal technique was performed for global imaging of the pelvis including uterus, ovaries, adnexal regions, and pelvic cul-de-sac.  It was necessary to proceed with endovaginal exam following the transabdominal exam to visualize the endometrial stripe and left ovarian lesion. Color and duplex Doppler ultrasound was utilized to evaluate blood flow to the ovaries.  COMPARISON:  None.  FINDINGS: Uterus  Measurements: Surgically absent. Vaginal cuff is unremarkable in appearance .  Endometrium  Thickness: Not applicable.  Right ovary  Measurements: 3.2 x 2.0 x 2.4 cm. Normal appearance/no adnexal mass.  Left ovary  Measurements: 6.1 x 4.8 x 6.3 cm. A complex cyst containing solid material with convex margins is seen which measures 4.4 x 3.7 x 5.4 cm. This lesion shows no internal blood flow within the solid material, although there is blood flow in surrounding ovarian tissue. This has features most consistent with a hemorrhagic cyst. A small amount of complex free fluid is also seen in the left adnexa  Pulsed Doppler evaluation of both ovaries demonstrates both low-resistance arterial and venous waveforms.  IMPRESSION: Probable benign hemorrhagic cyst of the left ovary measuring 5.4 cm, with mild free fluid in left adnexa.  Followup by pelvic ultrasound recommended in 6-12 weeks to confirm resolution.  No  definite sonographic evidence for ovarian torsion.  Previous hysterectomy.   Electronically Signed   By: Earle Gell M.D.   On: 05/03/2014 16:32    Patient with complete resolution of pain after Toradol administration.  No evidence of urinary tract infection, pregnancy test negative.  Highly doubt PID as patient has had a hysterectomy.  CT abdomen pelvis and pelvic ultrasound consistent with hemorrhagic cyst of the left ovary.  I discussed this and all other findings with the patient. She is to followup with her gynecologist next week. She reports this will not be a problem. She is requesting to eat. Will by mouth trial and discharge home.  It has been determined that no acute conditions requiring further emergency intervention are present at this time. The patient/guardian have been advised of the diagnosis and plan. We have discussed signs and symptoms that warrant return to the ED, such as changes or worsening in symptoms.   Vital signs are stable at discharge.   BP 125/80  Pulse 70  Temp(Src) 98.2 F (36.8 C) (Oral)  Resp 18  SpO2 98%  Patient/guardian has voiced understanding and agreed to follow-up with the PCP or specialist.       Abigail Butts, PA-C 05/03/14 1851

## 2014-05-03 NOTE — ED Notes (Signed)
Pt c/olower abd pain for a few days, pt states she has been having urinary problems such as urinating blood 5 days ago, and urinary frequency. Diagnosed with kidney stones 1 year ago.

## 2014-05-03 NOTE — Discharge Instructions (Signed)
1. Medications: ibuprofen, usual home medications 2. Treatment: rest, drink plenty of fluids,  3. Follow Up: Please followup with your GYN for discussion of your diagnoses and further evaluation after today's visit; if you do not have a primary care doctor use the resource guide provided to find one;     Ovarian Cyst An ovarian cyst is a fluid-filled sac that forms on an ovary. The ovaries are small organs that produce eggs in women. Various types of cysts can form on the ovaries. Most are not cancerous. Many do not cause problems, and they often go away on their own. Some may cause symptoms and require treatment. Common types of ovarian cysts include:  Functional cysts These cysts may occur every month during the menstrual cycle. This is normal. The cysts usually go away with the next menstrual cycle if the woman does not get pregnant. Usually, there are no symptoms with a functional cyst.  Endometrioma cysts These cysts form from the tissue that lines the uterus. They are also called "chocolate cysts" because they become filled with blood that turns brown. This type of cyst can cause pain in the lower abdomen during intercourse and with your menstrual period.  Cystadenoma cysts This type develops from the cells on the outside of the ovary. These cysts can get very big and cause lower abdomen pain and pain with intercourse. This type of cyst can twist on itself, cut off its blood supply, and cause severe pain. It can also easily rupture and cause a lot of pain.  Dermoid cysts This type of cyst is sometimes found in both ovaries. These cysts may contain different kinds of body tissue, such as skin, teeth, hair, or cartilage. They usually do not cause symptoms unless they get very big.  Theca lutein cysts These cysts occur when too much of a certain hormone (human chorionic gonadotropin) is produced and overstimulates the ovaries to produce an egg. This is most common after procedures used to assist  with the conception of a baby (in vitro fertilization). CAUSES   Fertility drugs can cause a condition in which multiple large cysts are formed on the ovaries. This is called ovarian hyperstimulation syndrome.  A condition called polycystic ovary syndrome can cause hormonal imbalances that can lead to nonfunctional ovarian cysts. SIGNS AND SYMPTOMS  Many ovarian cysts do not cause symptoms. If symptoms are present, they may include:  Pelvic pain or pressure.  Pain in the lower abdomen.  Pain during sexual intercourse.  Increasing girth (swelling) of the abdomen.  Abnormal menstrual periods.  Increasing pain with menstrual periods.  Stopping having menstrual periods without being pregnant. DIAGNOSIS  These cysts are commonly found during a routine or annual pelvic exam. Tests may be ordered to find out more about the cyst. These tests may include:  Ultrasound.  X-ray of the pelvis.  CT scan.  MRI.  Blood tests. TREATMENT  Many ovarian cysts go away on their own without treatment. Your health care provider may want to check your cyst regularly for 2 3 months to see if it changes. For women in menopause, it is particularly important to monitor a cyst closely because of the higher rate of ovarian cancer in menopausal women. When treatment is needed, it may include any of the following:  A procedure to drain the cyst (aspiration). This may be done using a long needle and ultrasound. It can also be done through a laparoscopic procedure. This involves using a thin, lighted tube with a tiny camera  on the end (laparoscope) inserted through a small incision.  Surgery to remove the whole cyst. This may be done using laparoscopic surgery or an open surgery involving a larger incision in the lower abdomen.  Hormone treatment or birth control pills. These methods are sometimes used to help dissolve a cyst. HOME CARE INSTRUCTIONS   Only take over-the-counter or prescription medicines as  directed by your health care provider.  Follow up with your health care provider as directed.  Get regular pelvic exams and Pap tests. SEEK MEDICAL CARE IF:   Your periods are late, irregular, or painful, or they stop.  Your pelvic pain or abdominal pain does not go away.  Your abdomen becomes larger or swollen.  You have pressure on your bladder or trouble emptying your bladder completely.  You have pain during sexual intercourse.  You have feelings of fullness, pressure, or discomfort in your stomach.  You lose weight for no apparent reason.  You feel generally ill.  You become constipated.  You lose your appetite.  You develop acne.  You have an increase in body and facial hair.  You are gaining weight, without changing your exercise and eating habits.  You think you are pregnant. SEEK IMMEDIATE MEDICAL CARE IF:   You have increasing abdominal pain.  You feel sick to your stomach (nauseous), and you throw up (vomit).  You develop a fever that comes on suddenly.  You have abdominal pain during a bowel movement.  Your menstrual periods become heavier than usual. Document Released: 11/13/2005 Document Revised: 09/03/2013 Document Reviewed: 07/21/2013 [facility] Patient Information 2014 [place].   Emergency Department Resource Guide 1) Find a Doctor and Pay Out of Pocket Although you won't have to find out who is covered by your insurance plan, it is a good idea to ask around and get recommendations. You will then need to call the office and see if the doctor you have chosen will accept you as a new patient and what types of options they offer for patients who are self-pay. Some doctors offer discounts or will set up payment plans for their patients who do not have insurance, but you will need to ask so you aren't surprised when you get to your appointment.  2) Contact Your Local Health Department Not all health departments have doctors that can see patients  for sick visits, but many do, so it is worth a call to see if yours does. If you don't know where your local health department is, you can check in your phone book. The CDC also has a tool to help you locate your state's health department, and many state websites also have listings of all of their local health departments.  3) Find a [facility] If your illness is not likely to be very severe or complicated, you may want to try a walk in clinic. These are popping up all over the country in pharmacies, drugstores, and shopping centers. They're usually staffed by nurse practitioners or physician assistants that have been trained to treat common illnesses and complaints. They're usually fairly quick and inexpensive. However, if you have serious medical issues or chronic medical problems, these are probably not your best option.  No Primary Care Doctor: - Call Health Connect at  [phone] - they can help you locate a primary care doctor that  accepts your insurance, provides certain services, etc. - Physician Referral Service- [phone]  Chronic Pain Problems: Organization         Address  Phone  Notes  [facility]  [phone] Patients need to be referred by their primary care doctor.   Medication Assistance: Organization         Address  Phone   Notes  [facility] Medication [address] [place], [place] [address] [phone] --Must be a resident of [facility] -- Must have NO insurance coverage whatsoever (no Medicaid/ Medicare, etc.) -- The pt. MUST have a primary care doctor that directs their care regularly and follows them in the community   MedAssist  [phone]   [employer]  [phone]    Agencies that provide inexpensive medical care: Organization         Address  Phone   Notes  [place]  [phone]   [facility]    [phone]   [facility] [address], [place] [address] [phone]   [facility] [address], [place] [phone]   Planned Parenthood    [phone]   [facility]    [phone]   [place] and [place] [address], [place] Phone:  [phone], Fax:  [phone] Hours of Operation:  9 am - 6 pm, M-F.  Also accepts Medicaid/Medicare and self-pay.  [facility] for [place] [address], [address], [place] Phone: [phone], Fax: [phone]. Hours of Operation:  8:30 am - 5:30 pm, M-F.  Also accepts Medicaid and self-pay.  [facility] [address], [place] Phone: [phone]   [place], [place], [place] [phone], Ext. [phone] Mondays & Thursdays: 7-9 AM.  First 15 patients are seen on a first come, first serve basis.    [place] Providers:  Organization         Address  Phone   Notes  [facility] [address], [place] [phone] Also accepts self-pay patients.  [facility] [address], [place]  [phone]   [place], [address], [place] [phone]   [facility] [address], [place] [phone]   Lucianne Lei [address], [place]   [phone] Only accepts [place] Access [place] patients after they have their name applied to their card.   Self-Pay (no insurance) in [facility]:  Organization         Address  Phone   Notes  Sickle Cell Patients, [facility] [place] [phone]   [facility] [place] [phone]   [facility] [place]  [place], [place], [place] [phone]   [facility]/Dr. Osei-Bonsu  [address] [place] or [address], [place] [phone] Phone number for both [place] and [place] locations is the same.  Urgent Medical and [facility] [address] [place] [phone]   [facility] [address], [place] or [address] [phone]   [facility] [address], [place] [phone], phone; [phone], fax Sees patients 1st and 3rd Saturday of every month.  Must not qualify for public or private insurance (  i.e. Medicaid, Medicare, [place] Health Choice, Veterans' Benefits)  Household income should be no more than 200% of the poverty level The clinic cannot treat you if you are pregnant or think you are pregnant  Sexually transmitted diseases are not treated at the clinic.    Dental Care: Organization         Address  Phone  Notes  [facility] Department of [facility] [phone] Accepts children up to age 74 who are enrolled in [place] or [place]; pregnant women with a Medicaid card; and children who have applied for Medicaid or [place] Health Choice, but were declined, whose parents can pay a reduced fee at time of service.  [facility] Department of [facility]  [address], [place] [phone] Accepts children up to age 35 who are enrolled in [place] or [place]; pregnant women with a Medicaid card; and children who have applied for Medicaid or [place] Health Choice, but were declined, whose parents can pay a reduced fee at time of service.  [place] Adult Dental Access PROGRAM  [place] [phone] Patients are seen by appointment only. Walk-ins are not accepted. [place] will see patients 63 years of age and older. Monday - Tuesday (8am-5pm) Most Wednesdays (8:30-5pm) $30 per visit, cash only  [facility] [place] Adult Dental Access PROGRAM  [address], [facility] [phone] Patients are seen by appointment only. Walk-ins are not accepted. [place] will see patients 20 years of age and older. One Wednesday Evening (Monthly: Volunteer Based).  $30 per visit, cash only  [place]  [phone] for adults; Children under age 33, call Graduate Pediatric Dentistry at [phone]. Children aged 49-14, please call [phone] to request a pediatric application.  Dental services are provided in all areas of dental care including fillings, crowns and bridges, complete and partial dentures, implants, gum treatment, root canals, and extractions. Preventive care is also provided. Treatment is provided to both adults and children. Patients are selected via a lottery and there is often a waiting list.   [facility] [address], [place]  [phone] [email or web address]   [facility] [address] [place], [place] [phone], Ext. [phone] Second and Fourth Thursday of each month, opens at 6:30 AM; Clinic ends at 9 AM.  Patients are seen on a first-come first-served basis, and a limited number are seen during each clinic.   [facility]  [address] Hillard Danker [place], [place] [phone]   Eligibility Requirements You must have lived in [place], [place], or [place] counties for at least the last three months.   You cannot be eligible for state or federal sponsored [employer], including [employer], [place], or [employer].   You generally cannot be eligible for healthcare insurance through your employer.    How to apply: Eligibility screenings are held every Tuesday and Wednesday afternoon from 1:00 pm until 4:00 pm. You do not need an appointment for the interview!  [facility] [address] [place], [place]   [place]  [place] Department  [place]  [phone]    Behavioral Health Resources in the Community: Intensive Outpatient Programs Organization         Address  Phone  Notes  [facility]  [place] [address] [place], [place] [phone]   [facility] [address] [place], [place]   ADS: Alcohol & Drug Svcs [address] [place], [place]   [place] [address]  [place], [place] or [phone]   Substance Abuse Resources Organization         Address  Phone  Notes  Alcohol and Drug Services  [phone]   [place]  [phone]   [facility] [place]   Chinita Pester  [phone]   Residential & Outpatient Substance Abuse Program  [phone]   Psychological Services Organization         Address  Phone  Notes  [facility] [place]  [place]  [phone]   [place] [address] [place] or [phone]    Mobile Crisis Teams Organization         Address  Phone  Notes  [facility], Mobile Crisis Care Unit  [phone]   [facility]  [address] [place], [place]   [place] Levels [address] [place] [phone]    Self-Help/Support Groups Organization         Address  Phone             Notes  [place]. of [place] - variety of support groups  [place] Call for more information  Narcotics Anonymous (NA), [facility] [address], [employer] [place]  2 meetings at this location   Special educational needs teacher         Address  Phone  Notes  [facility] Residential Treatment [place],    [place]  [phone]   [facility]  [address], [place] [phone], [place], [place]   [place], [place]  [phone] Admissions: 8am-3pm M-F  Incentives Substance [place] [address]    [place], [place] [phone]   [facility] [address] [place], [place], [place]   [facility] [address]  [place], [place]   [facility] Programs - Intensive Outpatient [address], [place], [place]   [facility] ([place].) [place]  [place], [place] [phone] or [phone]   [facility] (RTS) [address], [place], [place] Accepts Medicaid  [facility] [place] [address],  [place] [phone] Substance Abuse/Addiction Treatment   [facility] Organization         Address  Phone  Notes  [facility]  [phone]   Domenic Schwab, PhD [address] [place] [address] Arlis Porta [place], [place]   [phone] or [phone]   [place], [place] [phone]   [facility] [address], [place], [place] [phone] Insurance/Medicaid/sponsorship through [facility] and Families [address]                                    [place], [place] [phone] [place] [address], [place] [phone]    Dr. Adele Schilder  [phone]   [facility] Dept. 1)  [address], [place] 2) [place] 3)  [address], [place] [phone]  [phone]   [place] [phone] or [phone] (After Hours)

## 2014-05-03 NOTE — ED Notes (Signed)
Patient transported to CT 

## 2014-05-04 LAB — GC/CHLAMYDIA PROBE AMP
CT Probe RNA: NEGATIVE
GC Probe RNA: NEGATIVE

## 2014-05-04 NOTE — ED Provider Notes (Signed)
Medical screening examination/treatment/procedure(s) were performed by non-physician practitioner and as supervising physician I was immediately available for consultation/collaboration.  Leota Jacobsen, MD 05/04/14 [phone]

## 2014-05-04 NOTE — ED Provider Notes (Signed)
Medical screening examination/treatment/procedure(s) were performed by non-physician practitioner and as supervising physician I was immediately available for consultation/collaboration.   EKG Interpretation None        Merryl Hacker, MD 05/04/14 [phone]

## 2015-06-30 ENCOUNTER — Encounter (HOSPITAL_COMMUNITY): Payer: Self-pay | Admitting: *Deleted

## 2015-06-30 ENCOUNTER — Emergency Department (HOSPITAL_COMMUNITY): Payer: Medicaid Other

## 2015-06-30 ENCOUNTER — Emergency Department (HOSPITAL_COMMUNITY)
Admission: EM | Admit: 2015-06-30 | Discharge: 2015-06-30 | Disposition: A | Discharge status: Home / Self Care | Payer: Medicaid Other | Attending: Emergency Medicine | Admitting: Emergency Medicine

## 2015-06-30 DIAGNOSIS — M25561 Pain in right knee: Secondary | ICD-10-CM | POA: Insufficient documentation

## 2015-06-30 DIAGNOSIS — M25562 Pain in left knee: Secondary | ICD-10-CM | POA: Insufficient documentation

## 2015-06-30 DIAGNOSIS — M25461 Effusion, right knee: Secondary | ICD-10-CM | POA: Insufficient documentation

## 2015-06-30 DIAGNOSIS — Z791 Long term (current) use of non-steroidal anti-inflammatories (NSAID): Secondary | ICD-10-CM | POA: Insufficient documentation

## 2015-06-30 DIAGNOSIS — Z79899 Other long term (current) drug therapy: Secondary | ICD-10-CM | POA: Insufficient documentation

## 2015-06-30 MED ORDER — MELOXICAM 7.5 MG PO TABS: 7.5000 mg | ORAL_TABLET | Freq: Every day | ORAL | Status: DC

## 2015-06-30 NOTE — ED Provider Notes (Signed)
CSN: [id number]     Arrival date & time 8/3/Terri  0755 History   First MD Initiated Contact with Patient 08/03/Terri 0800     Chief Complaint  Patient presents with  . Knee Pain     (Consider location/radiation/quality/duration/timing/severity/associated sxs/prior Treatment) Patient is a 39 y.o. [place] presenting with knee pain. The history is provided by the patient. No language interpreter was used.  Knee Pain Location:  Knee Injury: no   Knee location:  R knee Pain details:    Quality:  Aching   Radiates to:  Does not radiate   Severity:  Moderate   Onset quality:  Gradual   Duration:  5 months   Timing:  Constant   Progression:  Worsening Chronicity:  New Dislocation: no   Foreign body present:  No foreign bodies Prior injury to area:  No Relieved by:  Nothing Worsened by:  Nothing tried Ineffective treatments:  None tried Associated symptoms: swelling   Risk factors: no concern for non-accidental trauma   Pt reports she has pain in right knee and grinding noise in left knee.  History reviewed. No pertinent past medical history. Past Surgical History  Procedure Laterality Date  . Abdominal hysterectomy     History reviewed. No pertinent family history. History  Substance Use Topics  . Smoking status: Never Smoker   . Smokeless tobacco: Not on file  . Alcohol Use: No   OB History    No data available     Review of Systems  All other systems reviewed and are negative.     Allergies  Rabbit protein and Tomato  Home Medications   Prior to Admission medications   Medication Sig Start Date End Date Taking? Authorizing Provider  ibuprofen (ADVIL,MOTRIN) 800 MG tablet Take 1 tablet (800 mg total) by mouth 3 (three) times daily. With food 05/03/14   Jarrett Soho Muthersbaugh, PA-C  phentermine 15 MG capsule Take 15 mg by mouth daily after breakfast.    Historical Provider, MD   BP 137/81 mmHg  Pulse 83  Temp(Src) 98.3 F (36.8 C) (Oral)  Resp 18  Ht 5\' 3"  (1.6 m)   Wt 210 lb (95.255 kg)  BMI 37.21 kg/m2  SpO2 100% Physical Exam  Constitutional: She appears well-developed and well-nourished.  Musculoskeletal: She exhibits tenderness.  Swollen right knee pain with movement.  nv and ns intact  Left knee crepitus with movement.  nv and ns intact  Neurological: She is alert.  Skin: Skin is warm.  Psychiatric: She has a normal mood and affect.  Nursing note and vitals reviewed.   ED Course  Procedures (including critical care time) Labs Review Labs Reviewed - No data to display  Imaging Review Dg Knee Complete 4 Views Left  06/30/2015   CLINICAL DATA:  Fall about 6 months ago, left knee pain  EXAM: LEFT KNEE - COMPLETE 4+ VIEW  COMPARISON:  None.  FINDINGS: Four views of the left knee submitted. No acute fracture or subluxation. Minimal narrowing of medial joint compartment. No joint effusion. No radiopaque foreign body. No pathologic calcifications.  IMPRESSION: No acute fracture or subluxation. Minimal narrowing of medial joint compartment. No joint effusion.   Electronically Signed   By: Lahoma Crocker M.D.   On: 06/30/2015 09:04   Dg Knee Complete 4 Views Right  06/30/2015   CLINICAL DATA:  Right knee pain about 4 days  EXAM: RIGHT KNEE - COMPLETE 4+ VIEW  COMPARISON:  None.  FINDINGS: There is no evidence of fracture, dislocation,  or joint effusion. There is no evidence of arthropathy or other focal bone abnormality. Soft tissues are unremarkable.  IMPRESSION: Negative.   Electronically Signed   By: Lahoma Crocker M.D.   On: 06/30/2015 09:02     EKG Interpretation None      MDM   Final diagnoses:  Knee pain, bilateral    Pt given rx for meloxicam to try Referal to Dr. Percell Miller.    Hollace Kinnier [place], PA-C 08/03/Terri 1436  Elnora Morrison, MD 08/03/Terri [phone]

## 2015-06-30 NOTE — ED Notes (Signed)
Declined W/C at D/C and was escorted to lobby by RN. 

## 2015-06-30 NOTE — Discharge Instructions (Signed)

## 2015-06-30 NOTE — ED Notes (Signed)
Pt reports a 5 month HX of Bil. Knee pain that is now worse due to recent exercise . Pt reports hearing a grinding sound in RT knee.

## 2015-06-30 NOTE — ED Notes (Signed)
Pt has returned from xray

## 2016-06-27 ENCOUNTER — Encounter (HOSPITAL_COMMUNITY): Payer: Self-pay

## 2016-06-27 ENCOUNTER — Emergency Department (HOSPITAL_COMMUNITY)
Admission: EM | Admit: 2016-06-27 | Discharge: 2016-06-27 | Disposition: A | Discharge status: Home / Self Care | Payer: Medicaid Other | Attending: Emergency Medicine | Admitting: Emergency Medicine

## 2016-06-27 DIAGNOSIS — N760 Acute vaginitis: Secondary | ICD-10-CM | POA: Insufficient documentation

## 2016-06-27 DIAGNOSIS — B9689 Other specified bacterial agents as the cause of diseases classified elsewhere: Secondary | ICD-10-CM

## 2016-06-27 DIAGNOSIS — N898 Other specified noninflammatory disorders of vagina: Secondary | ICD-10-CM | POA: Diagnosis present

## 2016-06-27 HISTORY — DX: Gestational diabetes mellitus in pregnancy, unspecified control: O24.419

## 2016-06-27 HISTORY — DX: Benign neoplasm of connective and other soft tissue, unspecified: D21.9

## 2016-06-27 LAB — WET PREP, GENITAL
Sperm: NONE SEEN
Trich, Wet Prep: NONE SEEN
Yeast Wet Prep HPF POC: NONE SEEN

## 2016-06-27 LAB — URINALYSIS, ROUTINE W REFLEX MICROSCOPIC
Bilirubin Urine: NEGATIVE
GLUCOSE, UA: NEGATIVE mg/dL
HGB URINE DIPSTICK: NEGATIVE
Ketones, ur: NEGATIVE mg/dL
Leukocytes, UA: NEGATIVE
Nitrite: NEGATIVE
PROTEIN: NEGATIVE mg/dL
Specific Gravity, Urine: 1.024 (ref 1.005–1.030)
pH: 6 (ref 5.0–8.0)

## 2016-06-27 LAB — POC URINE PREG, ED: Preg Test, Ur: NEGATIVE

## 2016-06-27 MED ORDER — METRONIDAZOLE 500 MG PO TABS: 500.0000 mg | ORAL_TABLET | Freq: Two times a day (BID) | ORAL | 0 refills | Status: DC

## 2016-06-27 MED ORDER — METRONIDAZOLE 500 MG PO TABS
500.0000 mg | ORAL_TABLET | Freq: Once | ORAL | Status: AC
  Administered 2016-06-27: 500 mg via ORAL
  Filled 2016-06-27: qty 1

## 2016-06-27 NOTE — ED Notes (Signed)
Pelvic cart at bedside. 

## 2016-06-27 NOTE — Discharge Instructions (Signed)
Please read and follow all provided instructions.  Your diagnoses today include:  1. BV (bacterial vaginosis)    Tests performed today include: Vital signs. See below for your results today.   Medications prescribed:  Take as prescribed   Home care instructions:  Follow any educational materials contained in this packet.  Follow-up instructions: Please follow-up with your primary care provider for further evaluation of symptoms and treatment   Return instructions:  Please return to the Emergency Department if you do not get better, if you get worse, or new symptoms OR  - Fever (temperature greater than 101.20F)  - Bleeding that does not stop with holding pressure to the area    -Severe pain (please note that you may be more sore the day after your accident)  - Chest Pain  - Difficulty breathing  - Severe nausea or vomiting  - Inability to tolerate food and liquids  - Passing out  - Skin becoming red around your wounds  - Change in mental status (confusion or lethargy)  - New numbness or weakness    Please return if you have any other emergent concerns.  Additional Information:  Your vital signs today were: BP 155/96 (BP Location: Right Arm)    Pulse 91    Temp 98.5 F (36.9 C) (Oral)    Resp 18    SpO2 100%  If your blood pressure (BP) was elevated above 135/85 this visit, please have this repeated by your doctor within one month. ---------------

## 2016-06-27 NOTE — ED Notes (Signed)
Patient able to ambulate independently  

## 2016-06-27 NOTE — ED Notes (Signed)
PA at bedside updating pt 

## 2016-06-27 NOTE — ED Triage Notes (Signed)
Per Pt, Pt is coming from home with complaints of a thick creamy foul-odor vaginal discharge. Pt reports using washes to get rid of odor with no success. Reports boyfriend cheated on her and she was tested for STDs and the results today all came back negative. Denies vaginal bleeding. Had a partial hysterecyomy due to fibroids in the past.

## 2016-06-27 NOTE — ED Provider Notes (Signed)
[facility] DEPT Provider Note   CSN: [id number] Arrival date & time: 06/27/16  1515  First Provider Contact:   First MD Initiated Contact with Patient 06/27/16 1621     By signing my name below, I, Terri Gibson, attest that this documentation has been prepared under the direction and in the presence of Shary Decamp, PA-C Electronically Signed: Soijett Gibson, ED Scribe. 06/27/16. 4:42 PM.   History   Chief Complaint Chief Complaint  Patient presents with  . Vaginal Discharge    HPI Terri Gibson is a 40 y.o. female who presents to the Emergency Department complaining of moderate vaginal discharge onset 1 week worsening today. Pt reports that last week she had a full STD panel completed at [facility] with negative results. Pt notes that her last sexual encounter was 8-9 months ago. She states that she is having associated symptoms of malodorous vaginal odor and 2/10 mild abdominal discomfort. She states that she has not tried any medications for the relief for her symptoms. She denies fever, chills, n/v/d, and any other symptoms.   The history is provided by the patient. No language interpreter was used.    Past Medical History:  Diagnosis Date  . Fibroids   . Gestational diabetes     There are no active problems to display for this patient.   Past Surgical History:  Procedure Laterality Date  . ABDOMINAL HYSTERECTOMY      OB History    No data available       Home Medications    Prior to Admission medications   Medication Sig Start Date End Date Taking? Authorizing Provider  ibuprofen (ADVIL,MOTRIN) 800 MG tablet Take 1 tablet (800 mg total) by mouth 3 (three) times daily. With food 05/03/14   Jarrett Soho Muthersbaugh, PA-C  meloxicam (MOBIC) 7.5 MG tablet Take 1 tablet (7.5 mg total) by mouth daily. 06/30/15   Fransico Meadow, PA-C  phentermine 15 MG capsule Take 15 mg by mouth daily after breakfast.    Historical Provider, MD    Family History No family history on  file.  Social History Social History  Substance Use Topics  . Smoking status: Never Smoker  . Smokeless tobacco: Never Used  . Alcohol use No     Allergies   Rabbit protein and Tomato   Review of Systems Review of Systems  A complete 10 system review of systems was obtained and all systems are negative except as noted in the HPI and PMH.    Physical Exam Updated Vital Signs BP 155/96 (BP Location: Right Arm)   Pulse 91   Temp 98.5 F (36.9 C) (Oral)   Resp 18   SpO2 100%   Physical Exam  Constitutional: She is oriented to person, place, and time. She appears well-developed and well-nourished. No distress.  HENT:  Head: Normocephalic and atraumatic.  Eyes: EOM are normal.  Neck: Neck supple.  Cardiovascular: Normal rate.   Pulmonary/Chest: Effort normal. No respiratory distress.  Abdominal: She exhibits no distension.  Genitourinary:  Genitourinary Comments: Chaperone present for exam.  Musculoskeletal: Normal range of motion.  Neurological: She is alert and oriented to person, place, and time.  Skin: Skin is warm and dry.  Psychiatric: She has a normal mood and affect. Her behavior is normal.  Nursing note and vitals reviewed.  Exam performed by Ozella Rocks,  exam chaperoned Date: 06/27/2016 Pelvic exam: normal external genitalia without evidence of trauma. VULVA: normal appearing vulva with no masses, tenderness or lesion. VAGINA:  normal appearing vagina with normal color and discharge, no lesions. CERVIX: normal appearing cervix without lesions, cervical motion tenderness absent, cervical os closed with out purulent discharge; vaginal discharge - clear, copious and creamy, Wet prep and DNA probe for chlamydia and GC obtained.   ADNEXA: normal adnexa in size, nontender and no masses UTERUS: uterus is normal size, shape, consistency and nontender.   ED Treatments / Results  DIAGNOSTIC STUDIES: Oxygen Saturation is 100% on RA, nl by my interpretation.     COORDINATION OF CARE: 4:26 PM Discussed treatment plan with pt at bedside which includes wet prep, GC/Chlamydia probe, and UA and pt agreed to plan.  Labs (all labs ordered are listed, but only abnormal results are displayed) Labs Reviewed  WET PREP, GENITAL - Abnormal; Notable for the following:       Result Value   Clue Cells Wet Prep HPF POC PRESENT (*)    WBC, Wet Prep HPF POC FEW (*)    All other components within normal limits  URINALYSIS, ROUTINE W REFLEX MICROSCOPIC (NOT AT [facility])  POC URINE PREG, ED  GC/CHLAMYDIA PROBE AMP ([place]) NOT AT [facility]   Radiology No results found.  Procedures Procedures (including critical care time)  Medications Ordered in ED Medications - No data to display   Initial Impression / Assessment and Plan / ED Course  I have reviewed the triage vital signs and the nursing notes.  Pertinent labs & imaging results that were available during my care of the patient were reviewed by me and considered in my medical decision making (see chart for details).  Clinical Course    Final Clinical Impressions(s) / ED Diagnoses  I have reviewed and evaluated the relevant laboratory values. I have reviewed and evaluated the relevant imaging studies. I have reviewed the relevant previous healthcare records. I obtained HPI from historian.  ED Course:  Assessment: Pt is a 40yF who presents with vaginal discharge x 1 week. Copious today. Notes fishy odor. No abdominal pain. Not sexually active x 8 months. Recent STD panel negative with results today. On exam, pt in NAD. Nontoxic/nonseptic appearing. VSS. Afebrile. Lungs CTA. Heart RRR. Abdomen nontender soft. GU exam with copious discharge. Odor noted. GC obtained. Wet prep with clue cells present. UA neg. Preg neg. Given Flagyl in ED as well as Rx. Plan is to [place] with follow up to PCP. At time of discharge, Patient is in no acute distress. Vital Signs are stable. Patient is able to ambulate. Patient able  to tolerate PO.    Disposition/Plan:  DC Home Additional Verbal discharge instructions given and discussed with patient.  Pt Instructed to f/u with PCP in the next week for evaluation and treatment of symptoms. Return precautions given Pt acknowledges and agrees with plan  Supervising Physician Pattricia Boss, MD  Final diagnoses:  BV (bacterial vaginosis)    New Prescriptions New Prescriptions   No medications on file  I personally performed the services described in this documentation, which was scribed in my presence. The recorded information has been reviewed and is accurate.     Shary Decamp, PA-C 06/27/16 1743    Pattricia Boss, MD 07/01/16 [phone]

## 2016-06-28 LAB — GC/CHLAMYDIA PROBE AMP (~~LOC~~) NOT AT ARMC
Chlamydia: NEGATIVE
NEISSERIA GONORRHEA: NEGATIVE

## 2016-07-20 ENCOUNTER — Emergency Department (HOSPITAL_COMMUNITY)
Admission: EM | Admit: 2016-07-20 | Discharge: 2016-07-20 | Disposition: A | Discharge status: Home / Self Care | Payer: Medicaid Other | Attending: Emergency Medicine | Admitting: Emergency Medicine

## 2016-07-20 ENCOUNTER — Encounter (HOSPITAL_COMMUNITY): Payer: Self-pay | Admitting: Emergency Medicine

## 2016-07-20 DIAGNOSIS — N390 Urinary tract infection, site not specified: Secondary | ICD-10-CM | POA: Insufficient documentation

## 2016-07-20 DIAGNOSIS — R350 Frequency of micturition: Secondary | ICD-10-CM | POA: Diagnosis present

## 2016-07-20 HISTORY — DX: Candidiasis, unspecified: B37.9

## 2016-07-20 LAB — URINALYSIS, ROUTINE W REFLEX MICROSCOPIC
Bilirubin Urine: NEGATIVE
Glucose, UA: NEGATIVE mg/dL
Hgb urine dipstick: NEGATIVE
Ketones, ur: NEGATIVE mg/dL
NITRITE: NEGATIVE
PROTEIN: NEGATIVE mg/dL
Specific Gravity, Urine: 1.017 (ref 1.005–1.030)
pH: 7 (ref 5.0–8.0)

## 2016-07-20 LAB — URINE MICROSCOPIC-ADD ON

## 2016-07-20 MED ORDER — CEPHALEXIN 500 MG PO CAPS: 500.0000 mg | ORAL_CAPSULE | Freq: Two times a day (BID) | ORAL | 0 refills | Status: AC

## 2016-07-20 NOTE — ED Triage Notes (Signed)
Pt coming from home. ABD that started yesterday that feels like cramps "like something wants to come out when I pee but nothing does." Pt urinated and smelled a strong odor. Pt saw really cloudy urine. Pt attempted to shower and sleep. Pt attempted to use the bathroom again strong odor still remained as well as a fishy smell. Pt admits to urinary urgency. States it was painful "like it wanted to come out and nothing would come out." Pt states there was blood in her urine. Pt has had a partial hysterectomy. Pt had unprotected sex this week. Partner is asymptomatic.

## 2016-07-20 NOTE — ED Notes (Signed)
Pt oob to br with steady gait 

## 2016-07-20 NOTE — ED Provider Notes (Signed)
I saw and evaluated the patient, reviewed the resident's note and I agree with the findings and plan.   48 hours of urinary symptoms. Slight opressure on my exam. VS WNL. hasd some back pain yesterday, possibly early pyelo? Will await urine and disposition.    Merrily Pew, MD 07/20/16 [phone]

## 2016-07-20 NOTE — ED Notes (Signed)
Pt states she understands instructions. Home stable witih steady gait.

## 2016-07-20 NOTE — ED Provider Notes (Signed)
[facility] DEPT Provider Note   CSN: [id number] Arrival date & time: 07/20/16  0645  History   Chief Complaint Chief Complaint  Patient presents with  . Abdominal Pain  . Urinary Frequency    HPI Terri Gibson is a 40 y.o. female.  HPI  Patient presenting with abdominal pain and increased urinary frequency x1d.   Patient reports yesterday, she began experiencing a pulling sensation below her umbilicus when urinating. Also endorses feeling the urge to urinate frequently, but being able to only produce a small amount of urine each time. Reports that urine has been darker than usual and foul smelling. She began drinking a lot of water to try to improve her symptoms, but says her urine remained dark. She endorses hematuria x1. Denies dysuria, fever, N/V. Also reporting lower back pain bilaterally yesterday. She was diagnosed with BV on 08/01, and finished her course of Flagyl. She denies vaginal discharge or irritation since then. Has had one episode of unprotected intercourse since then but no subsequent symptoms. Has had a hysterectomy.   Past Medical History:  Diagnosis Date  . Fibroids   . Gestational diabetes   . Yeast infection     There are no active problems to display for this patient.   Past Surgical History:  Procedure Laterality Date  . ABDOMINAL HYSTERECTOMY      OB History    No data available     Home Medications    Prior to Admission medications   Medication Sig Start Date End Date Taking? Authorizing Provider  cephALEXin (KEFLEX) 500 MG capsule Take 1 capsule (500 mg total) by mouth 2 (two) times daily. 07/20/16 07/27/16  Verner Mould, MD    Family History History reviewed. No pertinent family history.  Social History Social History  Substance Use Topics  . Smoking status: Never Smoker  . Smokeless tobacco: Never Used  . Alcohol use No   Allergies   Other; Rabbit protein; and Tomato  Review of Systems Review of Systems    Constitutional: Negative for appetite change, chills and fever.  Gastrointestinal: Positive for abdominal pain. Negative for diarrhea, nausea and vomiting.  Genitourinary: Positive for difficulty urinating, hematuria and urgency. Negative for dysuria, vaginal bleeding, vaginal discharge and vaginal pain.  Musculoskeletal: Positive for back pain.  Allergic/Immunologic: Negative for immunocompromised state.  Neurological: Negative for headaches.   Physical Exam Updated Vital Signs Ht 5\' 3"  (1.6 m)   Wt 107 kg   BMI 41.81 kg/m   Physical Exam  Constitutional: She is oriented to person, place, and time. She appears well-developed and well-nourished. No distress.  HENT:  Head: Normocephalic and atraumatic.  Nose: Nose normal.  Mouth/Throat: Oropharynx is clear and moist. No oropharyngeal exudate.  Eyes: Conjunctivae are normal. Pupils are equal, round, and reactive to light. Right eye exhibits no discharge. Left eye exhibits no discharge. No scleral icterus.  Cardiovascular: Normal rate, regular rhythm and normal heart sounds.   No murmur heard. Pulmonary/Chest: Effort normal and breath sounds normal. No respiratory distress. She has no wheezes.  Abdominal: Soft. Bowel sounds are normal. She exhibits no distension. There is tenderness (to deep palpation below umbilicus).  Musculoskeletal:  Negative CVA tenderness bilaterally  Neurological: She is alert and oriented to person, place, and time.  Skin: Skin is warm and dry. She is not diaphoretic.  Psychiatric: She has a normal mood and affect. Her behavior is normal.    ED Treatments / Results  Labs (all labs ordered are listed, but only  abnormal results are displayed) Labs Reviewed  URINALYSIS, ROUTINE W REFLEX MICROSCOPIC (NOT AT [facility]) - Abnormal; Notable for the following:       Result Value   Leukocytes, UA SMALL (*)    All other components within normal limits  URINE MICROSCOPIC-ADD ON - Abnormal; Notable for the following:     Squamous Epithelial / LPF 0-5 (*)    Bacteria, UA FEW (*)    All other components within normal limits  URINE CULTURE    EKG  EKG Interpretation None       Radiology No results found.  Procedures Procedures (including critical care time)  Medications Ordered in ED Medications - No data to display   Initial Impression / Assessment and Plan / ED Course  I have reviewed the triage vital signs and the nursing notes.  Pertinent labs & imaging results that were available during my care of the patient were reviewed by me and considered in my medical decision making (see chart for details).  Clinical Course   Final Clinical Impressions(s) / ED Diagnoses   Final diagnoses:  UTI (lower urinary tract infection)   Patient presenting with abdominal pain and increased urinary frequency concerning for UTI. Also with back pain, however no CVA tenderness and afebrile, so pyelonephritis less likely. UA with few bacteria and small leukocytes, however as patient is symptomatic, will treat with Keflex x7d. Return precautions given.    New Prescriptions New Prescriptions   CEPHALEXIN (KEFLEX) 500 MG CAPSULE    Take 1 capsule (500 mg total) by mouth 2 (two) times daily.     Verner Mould, MD 07/20/16 [id number]    Merrily Pew, MD 07/20/16 [phone]

## 2016-07-20 NOTE — Discharge Instructions (Signed)
Please begin taking Keflex (cephalexin) 500 mg twice a day for the next week. If your symptoms have not improved by then, please call your primary doctor. If you develop high fevers, chills, nausea or vomiting, please return to the emergency room.

## 2016-07-22 LAB — URINE CULTURE

## 2016-07-23 ENCOUNTER — Telehealth (HOSPITAL_COMMUNITY): Payer: Self-pay

## 2016-07-23 NOTE — Telephone Encounter (Signed)
Post ED Visit - Positive Culture Follow-up  Culture report reviewed by antimicrobial stewardship pharmacist:  []  Elenor Quinones, Pharm.D. []  Heide Guile, Pharm.D., BCPS []  Parks Neptune, Pharm.D. []  Alycia Rossetti, Pharm.D., BCPS []  [place], [place].D., BCPS, AAHIVP []  Legrand Como, Pharm.D., BCPS, AAHIVP []  Milus Glazier, Pharm.D. []  Rob Evette Doffing, [place].D. Bonnee Quin, Pharm.D.  Positive urine culture, >/= 100,000 colonies -> Proteus Mirabilis Treated with Cephalexin, organism sensitive to the same and no further patient follow-up is required at this time.   Dortha Kern 07/23/2016, 11:48 AM

## 2017-03-27 ENCOUNTER — Emergency Department (HOSPITAL_COMMUNITY)
Admission: EM | Admit: 2017-03-27 | Discharge: 2017-03-27 | Disposition: A | Discharge status: Home / Self Care | Payer: BLUE CROSS/BLUE SHIELD | Attending: Emergency Medicine | Admitting: Emergency Medicine

## 2017-03-27 ENCOUNTER — Encounter (HOSPITAL_COMMUNITY): Payer: Self-pay | Admitting: Emergency Medicine

## 2017-03-27 DIAGNOSIS — K529 Noninfective gastroenteritis and colitis, unspecified: Secondary | ICD-10-CM | POA: Insufficient documentation

## 2017-03-27 DIAGNOSIS — R112 Nausea with vomiting, unspecified: Secondary | ICD-10-CM | POA: Diagnosis present

## 2017-03-27 LAB — COMPREHENSIVE METABOLIC PANEL
ALT: 14 U/L (ref 14–54)
ANION GAP: 7 (ref 5–15)
AST: 18 U/L (ref 15–41)
Albumin: 4.1 g/dL (ref 3.5–5.0)
Alkaline Phosphatase: 78 U/L (ref 38–126)
BILIRUBIN TOTAL: 0.6 mg/dL (ref 0.3–1.2)
BUN: 10 mg/dL (ref 6–20)
CHLORIDE: 109 mmol/L (ref 101–111)
CO2: 24 mmol/L (ref 22–32)
Calcium: 8.7 mg/dL — ABNORMAL LOW (ref 8.9–10.3)
Creatinine, Ser: 0.87 mg/dL (ref 0.44–1.00)
GFR calc Af Amer: >60 mL/min (ref >60)
Glucose, Bld: 102 mg/dL — ABNORMAL HIGH (ref 65–99)
POTASSIUM: 3.9 mmol/L (ref 3.5–5.1)
Sodium: 140 mmol/L (ref 135–145)
TOTAL PROTEIN: 7.3 g/dL (ref 6.5–8.1)

## 2017-03-27 LAB — CBC
HEMATOCRIT: 40.2 % (ref 36.0–46.0)
Hemoglobin: 13.2 g/dL (ref 12.0–15.0)
MCH: 28.1 pg (ref 26.0–34.0)
MCHC: 32.8 g/dL (ref 30.0–36.0)
MCV: 85.5 fL (ref 78.0–100.0)
Platelets: 221 10*3/uL (ref 150–400)
RBC: 4.7 MIL/uL (ref 3.87–5.11)
RDW: 14.3 % (ref 11.5–15.5)
WBC: 17.2 10*3/uL — ABNORMAL HIGH (ref 4.0–10.5)

## 2017-03-27 LAB — URINALYSIS, ROUTINE W REFLEX MICROSCOPIC
BACTERIA UA: NONE SEEN
BILIRUBIN URINE: NEGATIVE
Glucose, UA: NEGATIVE mg/dL
KETONES UR: NEGATIVE mg/dL
LEUKOCYTES UA: NEGATIVE
NITRITE: NEGATIVE
PH: 5 (ref 5.0–8.0)
Protein, ur: NEGATIVE mg/dL
SPECIFIC GRAVITY, URINE: 1.029 (ref 1.005–1.030)

## 2017-03-27 LAB — LIPASE, BLOOD: LIPASE: 25 U/L (ref 11–51)

## 2017-03-27 MED ORDER — ONDANSETRON 8 MG PO TBDP: ORAL_TABLET | ORAL | 0 refills | Status: DC

## 2017-03-27 MED ORDER — KETOROLAC TROMETHAMINE 30 MG/ML IJ SOLN
30.0000 mg | Freq: Once | INTRAMUSCULAR | Status: AC
  Administered 2017-03-27: 30 mg via INTRAVENOUS
  Filled 2017-03-27: qty 1

## 2017-03-27 MED ORDER — SODIUM CHLORIDE 0.9 % IV BOLUS (SEPSIS)
1000.0000 mL | Freq: Once | INTRAVENOUS | Status: AC
  Administered 2017-03-27: 1000 mL via INTRAVENOUS

## 2017-03-27 MED ORDER — ONDANSETRON HCL 4 MG/2ML IJ SOLN
4.0000 mg | Freq: Once | INTRAMUSCULAR | Status: AC
  Administered 2017-03-27: 4 mg via INTRAVENOUS
  Filled 2017-03-27: qty 2

## 2017-03-27 NOTE — ED Triage Notes (Signed)
Pt states she has had nausea, vomiting, diarrhea, abd pain and lower back pain since 10pm tonight  Pt states she is having dizziness

## 2017-03-27 NOTE — Discharge Instructions (Signed)
Zofran as prescribed as needed for nausea. ° °Return to the emergency department if you develop worsening pain, high fevers, bloody stools, or other new and concerning symptoms. °

## 2017-03-27 NOTE — ED Provider Notes (Signed)
[facility] DEPT Provider Note   CSN: [id number] Arrival date & time: 03/27/17  0100  By signing my name below, I, Margit Banda, attest that this documentation has been prepared under the direction and in the presence of Veryl Speak, MD. Electronically Signed: Margit Banda, ED Scribe. 03/27/17. 3:14 AM.   History   Chief Complaint Chief Complaint  Patient presents with  . Emesis  . Diarrhea    HPI Terri Gibson is a 41 y.o. female who presents to the Emergency Department complaining of sudden emesis that started ~ 8 pm on 03/26/17. Associated sx include nausea, diarrhea, cramping abdominal pain, dizziness, light-headedness, weakness, and lower back pain. Ate [employer] ~ 1 pm on 03/26/17. Her husband ate the same thing and is fine. No one else is sick. Pt denies blood in stool, hematemesis and fever.  The history is provided by the patient. No language interpreter was used.  Emesis   This is a new problem. The current episode started 6 to 12 hours ago. The problem has been gradually improving. There has been no fever. Associated symptoms include abdominal pain and diarrhea. Pertinent negatives include no fever.  Diarrhea   This is a new problem. The current episode started 6 to 12 hours ago. The problem has not changed since onset.There has been no fever. Associated symptoms include abdominal pain and vomiting.    Past Medical History:  Diagnosis Date  . Fibroids   . Gestational diabetes   . Yeast infection     There are no active problems to display for this patient.   Past Surgical History:  Procedure Laterality Date  . ABDOMINAL HYSTERECTOMY      OB History    No data available       Home Medications    Prior to Admission medications   Not on File    Family History Family History  Problem Relation Age of Onset  . Diabetes Other   . Hyperlipidemia Other   . Hypertension Other     Social History Social History  Substance Use Topics  . Smoking  status: Never Smoker  . Smokeless tobacco: Never Used  . Alcohol use No     Allergies   Other; Rabbit protein; and Tomato   Review of Systems Review of Systems  Constitutional: Negative for fever.  Gastrointestinal: Positive for abdominal pain, diarrhea, nausea and vomiting. Negative for blood in stool.  Musculoskeletal: Positive for back pain.  Neurological: Positive for dizziness, weakness and light-headedness.  All other systems reviewed and are negative.    Physical Exam Updated Vital Signs BP 128/80 (BP Location: Right Arm)   Pulse 95   Temp 98.4 F (36.9 C) (Oral)   Resp 16   Ht 5\' 3"  (1.6 m)   Wt 250 lb (113.4 kg)   SpO2 100%   BMI 44.29 kg/m   Physical Exam  Constitutional: She is oriented to person, place, and time. She appears well-developed and well-nourished. No distress.  HENT:  Head: Normocephalic and atraumatic.  Right Ear: Hearing normal.  Left Ear: Hearing normal.  Nose: Nose normal.  Mouth/Throat: Oropharynx is clear and moist and mucous membranes are normal.  Eyes: Conjunctivae and EOM are normal. Pupils are equal, round, and reactive to light.  Neck: Normal range of motion. Neck supple.  Cardiovascular: Regular rhythm, S1 normal and S2 normal.  Exam reveals no gallop and no friction rub.   No murmur heard. Pulmonary/Chest: Effort normal and breath sounds normal. No respiratory distress. She exhibits no  tenderness.  Abdominal: Soft. Normal appearance and bowel sounds are normal. There is no hepatosplenomegaly. There is tenderness. There is no rebound, no guarding, no tenderness at McBurney's point and negative Murphy's sign. No hernia.  Mild generalized tenderness.  Musculoskeletal: Normal range of motion.  Neurological: She is alert and oriented to person, place, and time. She has normal strength. No cranial nerve deficit or sensory deficit. Coordination normal. GCS eye subscore is 4. GCS verbal subscore is 5. GCS motor subscore is 6.  Skin: Skin  is warm, dry and intact. No rash noted. No cyanosis.  Psychiatric: She has a normal mood and affect. Her speech is normal and behavior is normal. Thought content normal.  Nursing note and vitals reviewed.    ED Treatments / Results  DIAGNOSTIC STUDIES: Oxygen Saturation is 100% on RA, normal by my interpretation.   COORDINATION OF CARE: 3:14 AM-Discussed next steps with pt. Pt verbalized understanding and is agreeable with the plan.    Labs (all labs ordered are listed, but only abnormal results are displayed) Labs Reviewed  COMPREHENSIVE METABOLIC PANEL - Abnormal; Notable for the following:       Result Value   Glucose, Bld 102 (*)    Calcium 8.7 (*)    All other components within normal limits  CBC - Abnormal; Notable for the following:    WBC 17.2 (*)    All other components within normal limits  LIPASE, BLOOD  URINALYSIS, ROUTINE W REFLEX MICROSCOPIC    EKG  EKG Interpretation None       Radiology No results found.  Procedures Procedures (including critical care time)  Medications Ordered in ED Medications - No data to display   Initial Impression / Assessment and Plan / ED Course  I have reviewed the triage vital signs and the nursing notes.  Pertinent labs & imaging results that were available during my care of the patient were reviewed by me and considered in my medical decision making (see chart for details).  Patient's presentation, exam, and workup are all consistent with a viral gastroenteritis. She was given IV fluids, anti-emetics, and Toradol and is feeling much better. Her abdominal exam is benign and I see no indication for imaging. She will be discharged with Zofran, clear liquid diet, and return as needed if symptoms worsen or change.  Final Clinical Impressions(s) / ED Diagnoses   Final diagnoses:  None    New Prescriptions New Prescriptions   No medications on file   I personally performed the services described in this  documentation, which was scribed in my presence. The recorded information has been reviewed and is accurate.       Veryl Speak, MD 03/27/17 [phone]

## 2017-07-14 ENCOUNTER — Encounter (HOSPITAL_COMMUNITY): Payer: Self-pay | Admitting: Emergency Medicine

## 2017-07-14 ENCOUNTER — Emergency Department (HOSPITAL_COMMUNITY)
Admission: EM | Admit: 2017-07-14 | Discharge: 2017-07-14 | Disposition: A | Discharge status: Home / Self Care | Payer: BLUE CROSS/BLUE SHIELD | Attending: Emergency Medicine | Admitting: Emergency Medicine

## 2017-07-14 DIAGNOSIS — N898 Other specified noninflammatory disorders of vagina: Secondary | ICD-10-CM

## 2017-07-14 DIAGNOSIS — R1030 Lower abdominal pain, unspecified: Secondary | ICD-10-CM | POA: Insufficient documentation

## 2017-07-14 LAB — CBC
HCT: 40.3 % (ref 36.0–46.0)
HEMOGLOBIN: 12.9 g/dL (ref 12.0–15.0)
MCH: 27.4 pg (ref 26.0–34.0)
MCHC: 32 g/dL (ref 30.0–36.0)
MCV: 85.7 fL (ref 78.0–100.0)
PLATELETS: 257 10*3/uL (ref 150–400)
RBC: 4.7 MIL/uL (ref 3.87–5.11)
RDW: 14.6 % (ref 11.5–15.5)
WBC: 14.8 10*3/uL — ABNORMAL HIGH (ref 4.0–10.5)

## 2017-07-14 LAB — URINALYSIS, ROUTINE W REFLEX MICROSCOPIC
BILIRUBIN URINE: NEGATIVE
Glucose, UA: NEGATIVE mg/dL
Hgb urine dipstick: NEGATIVE
Ketones, ur: NEGATIVE mg/dL
Leukocytes, UA: NEGATIVE
NITRITE: NEGATIVE
PROTEIN: NEGATIVE mg/dL
SPECIFIC GRAVITY, URINE: 1.014 (ref 1.005–1.030)
pH: 6 (ref 5.0–8.0)

## 2017-07-14 LAB — COMPREHENSIVE METABOLIC PANEL
ALK PHOS: 72 U/L (ref 38–126)
ALT: 11 U/L — ABNORMAL LOW (ref 14–54)
ANION GAP: 7 (ref 5–15)
AST: 18 U/L (ref 15–41)
Albumin: 3.8 g/dL (ref 3.5–5.0)
BILIRUBIN TOTAL: 0.5 mg/dL (ref 0.3–1.2)
BUN: <5 mg/dL — ABNORMAL LOW (ref 6–20)
CALCIUM: 9 mg/dL (ref 8.9–10.3)
CO2: 26 mmol/L (ref 22–32)
Chloride: 106 mmol/L (ref 101–111)
Glucose, Bld: 94 mg/dL (ref 65–99)
Potassium: 3.5 mmol/L (ref 3.5–5.1)
Sodium: 139 mmol/L (ref 135–145)
TOTAL PROTEIN: 6.8 g/dL (ref 6.5–8.1)

## 2017-07-14 LAB — WET PREP, GENITAL
Clue Cells Wet Prep HPF POC: NONE SEEN
SPERM: NONE SEEN
TRICH WET PREP: NONE SEEN
YEAST WET PREP: NONE SEEN

## 2017-07-14 LAB — LIPASE, BLOOD: Lipase: 31 U/L (ref 11–51)

## 2017-07-14 NOTE — Discharge Instructions (Signed)
Avoid using any personal hygiene products. Wash with water. Follow-up with family doctor as needed.

## 2017-07-14 NOTE — ED Triage Notes (Signed)
Pt with vaginal discharge and abdominal pressure, superior to the groin starting three days ago. Urine was dark and orange and now is cloudy per patient. Pain 4/10. Tylenol PTA 1445.

## 2017-07-14 NOTE — ED Provider Notes (Signed)
[facility] DEPT Provider Note   CSN: [id number] Arrival date & time: 07/14/17  1452  By signing my name below, I, Ny'Kea Lewis, attest that this documentation has been prepared under the direction and in the presence of Aliviya Schoeller, PA-C. Electronically Signed: Lise Auer, ED Scribe. 07/14/17. 8:46 PM.  History   Chief Complaint Chief Complaint  Patient presents with  . Vaginal Discharge  . Abdominal Pain    ab pressure   The history is provided by the patient. No language interpreter was used.    HPI Comments: Terri Gibson is a 41 y.o. female with a PMHx of fibroids, who presents to the Emergency Department complaining of sudden onset, persistent, abnormal vaginal discharge that began three days ago. She notes associated "fish-like" foul odor and bright orange urine. Pt reports three days ago she began to experience an increasing amount of vaginal discharge. She denies any new changes in her hygiene routine. She tried Monistat which she state took away the foul odor and [place] V, which she states has no improvement of her symptoms. She notes with increasing her water intake so was able to change the color of her urine.  Pt states she has not currently sexually active. She endorses a PSHx of a partial hysterectomy.  She denies vaginal bleeding or itching.   Past Medical History:  Diagnosis Date  . Fibroids   . Gestational diabetes   . Yeast infection    There are no active problems to display for this patient.  Past Surgical History:  Procedure Laterality Date  . ABDOMINAL HYSTERECTOMY     OB History    No data available     Home Medications    Prior to Admission medications   Medication Sig Start Date End Date Taking? Authorizing Provider  acetaminophen (TYLENOL) 500 MG tablet Take 1,000 mg by mouth every 6 (six) hours as needed for headache (pain).   Yes [provider]  ondansetron (ZOFRAN ODT) 8 MG disintegrating tablet 8mg  ODT q4 hours prn  nausea Patient not taking: Reported on 07/14/2017 03/27/17   Veryl Speak, MD  ondansetron (ZOFRAN ODT) 8 MG disintegrating tablet 8mg  ODT q4 hours prn nausea Patient not taking: Reported on 07/14/2017 03/27/17   Veryl Speak, MD   Family History Family History  Problem Relation Age of Onset  . Diabetes Other   . Hyperlipidemia Other   . Hypertension Other    Social History Social History  Substance Use Topics  . Smoking status: Never Smoker  . Smokeless tobacco: Never Used  . Alcohol use No   Allergies   Rabbit protein and Tomato  Review of Systems Review of Systems  Gastrointestinal: Positive for abdominal pain.  Genitourinary: Positive for vaginal discharge. Negative for vaginal bleeding.   Physical Exam Updated Vital Signs BP 115/69 (BP Location: Right Arm)   Pulse 67   Temp 98.7 F (37.1 C) (Oral)   Resp 16   Wt 240 lb (108.9 kg)   SpO2 100%   BMI 42.51 kg/m   Physical Exam  Constitutional: She is oriented to person, place, and time. She appears well-developed and well-nourished.  HENT:  Head: Normocephalic.  Eyes: EOM are normal.  Neck: Normal range of motion.  Pulmonary/Chest: Effort normal.  Abdominal: She exhibits no distension.  Genitourinary:  Genitourinary Comments: Normal external genitalia. Normal vaginal canal. Small thin white discharge. No cervix preset.    Musculoskeletal: Normal range of motion.  Neurological: She is alert and oriented to person, place, and time.  Psychiatric: She has a normal mood and affect.  Nursing note and vitals reviewed.   ED Treatments / Results  DIAGNOSTIC STUDIES: Oxygen Saturation is 100% on RA, normal by my interpretation.   COORDINATION OF CARE: 8:39 PM-Discussed next steps with pt. Pt verbalized understanding and is agreeable with the plan.   Labs (all labs ordered are listed, but only abnormal results are displayed) Labs Reviewed  COMPREHENSIVE METABOLIC PANEL - Abnormal; Notable for the following:        Result Value   BUN <5 (*)    Creatinine, Ser <0.30 (*)    ALT 11 (*)    All other components within normal limits  CBC - Abnormal; Notable for the following:    WBC 14.8 (*)    All other components within normal limits  LIPASE, BLOOD  URINALYSIS, ROUTINE W REFLEX MICROSCOPIC  POC URINE PREG, ED    EKG  EKG Interpretation None       Radiology No results found.  Procedures Procedures (including critical care time)  Medications Ordered in ED Medications - No data to display   Initial Impression / Assessment and Plan / ED Course  I have reviewed the triage vital signs and the nursing notes.  Pertinent labs & imaging results that were available during my care of the patient were reviewed by me and considered in my medical decision making (see chart for details).     Patient in emergency department for vaginal discharge and order. She does not have a uterus, has had partial hysterectomy. Her exam is unremarkable. Her labs are unremarkable except for elevated white blood cell count which appears to be chronic. The urinalysis is normal. Her wet prep showing only a few WBCs, otherwise negative. She denies intercourse in the last 6 months. She denies any possibility of STI. We discussed results, patient is comfortable with no treatment, discussed no personal scented products. Follow up with pcp as needed.   Vitals:   07/14/17 1520 07/14/17 1521 07/14/17 1859 07/14/17 2148  BP:  128/71 115/69 124/68  Pulse:  79 67 60  Resp:  16 16 18   Temp:  98.6 F (37 C) 98.7 F (37.1 C)   TempSrc:   Oral   SpO2:  100% 100% 100%  Weight: 108.9 kg (240 lb)        Final Clinical Impressions(s) / ED Diagnoses   Final diagnoses:  Vaginal discharge    New Prescriptions Discharge Medication List as of 07/14/2017  9:42 PM      I personally performed the services described in this documentation, which was scribed in my presence. The recorded information has been reviewed and is  accurate.     Jeannett Senior, PA-C 07/15/17 1638    Merrily Pew, MD 07/15/17 1251

## 2017-07-16 LAB — GC/CHLAMYDIA PROBE AMP (~~LOC~~) NOT AT ARMC
CHLAMYDIA, DNA PROBE: NEGATIVE
NEISSERIA GONORRHEA: NEGATIVE

## 2018-02-07 ENCOUNTER — Emergency Department (HOSPITAL_COMMUNITY)
Admission: EM | Admit: 2018-02-07 | Discharge: 2018-02-07 | Disposition: A | Discharge status: Home / Self Care | Payer: BLUE CROSS/BLUE SHIELD | Attending: Emergency Medicine | Admitting: Emergency Medicine

## 2018-02-07 ENCOUNTER — Emergency Department (HOSPITAL_COMMUNITY): Payer: BLUE CROSS/BLUE SHIELD

## 2018-02-07 ENCOUNTER — Encounter (HOSPITAL_COMMUNITY): Payer: Self-pay | Admitting: Pharmacy Technician

## 2018-02-07 DIAGNOSIS — Y998 Other external cause status: Secondary | ICD-10-CM | POA: Insufficient documentation

## 2018-02-07 DIAGNOSIS — R0789 Other chest pain: Secondary | ICD-10-CM | POA: Diagnosis not present

## 2018-02-07 DIAGNOSIS — T148XXA Other injury of unspecified body region, initial encounter: Secondary | ICD-10-CM | POA: Diagnosis not present

## 2018-02-07 DIAGNOSIS — Y92410 Unspecified street and highway as the place of occurrence of the external cause: Secondary | ICD-10-CM | POA: Insufficient documentation

## 2018-02-07 DIAGNOSIS — S199XXA Unspecified injury of neck, initial encounter: Secondary | ICD-10-CM | POA: Diagnosis present

## 2018-02-07 DIAGNOSIS — Y939 Activity, unspecified: Secondary | ICD-10-CM | POA: Diagnosis not present

## 2018-02-07 DIAGNOSIS — S161XXA Strain of muscle, fascia and tendon at neck level, initial encounter: Secondary | ICD-10-CM | POA: Diagnosis not present

## 2018-02-07 DIAGNOSIS — T07XXXA Unspecified multiple injuries, initial encounter: Secondary | ICD-10-CM

## 2018-02-07 LAB — BASIC METABOLIC PANEL
ANION GAP: 10 (ref 5–15)
BUN: 11 mg/dL (ref 6–20)
CALCIUM: 8.9 mg/dL (ref 8.9–10.3)
CO2: 23 mmol/L (ref 22–32)
CREATININE: 0.99 mg/dL (ref 0.44–1.00)
Chloride: 104 mmol/L (ref 101–111)
GFR calc Af Amer: >60 mL/min (ref >60)
GFR calc non Af Amer: >60 mL/min (ref >60)
GLUCOSE: 99 mg/dL (ref 65–99)
Potassium: 3.9 mmol/L (ref 3.5–5.1)
Sodium: 137 mmol/L (ref 135–145)

## 2018-02-07 LAB — CBC
HCT: 41 % (ref 36.0–46.0)
HEMOGLOBIN: 13.4 g/dL (ref 12.0–15.0)
MCH: 28 pg (ref 26.0–34.0)
MCHC: 32.7 g/dL (ref 30.0–36.0)
MCV: 85.6 fL (ref 78.0–100.0)
PLATELETS: 227 10*3/uL (ref 150–400)
RBC: 4.79 MIL/uL (ref 3.87–5.11)
RDW: 13.8 % (ref 11.5–15.5)
WBC: 10.9 10*3/uL — AB (ref 4.0–10.5)

## 2018-02-07 MED ORDER — IOPAMIDOL (ISOVUE-300) INJECTION 61%
100.0000 mL | Freq: Once | INTRAVENOUS | Status: AC | PRN
  Administered 2018-02-07: 100 mL via INTRAVENOUS

## 2018-02-07 MED ORDER — NAPROXEN 500 MG PO TABS: 500.0000 mg | ORAL_TABLET | Freq: Two times a day (BID) | ORAL | 0 refills | Status: DC

## 2018-02-07 MED ORDER — SODIUM CHLORIDE 0.9 % IV BOLUS (SEPSIS)
1000.0000 mL | Freq: Once | INTRAVENOUS | Status: AC
  Administered 2018-02-07: 1000 mL via INTRAVENOUS

## 2018-02-07 MED ORDER — SODIUM CHLORIDE 0.9 % IV SOLN: INTRAVENOUS | Status: DC

## 2018-02-07 MED ORDER — FENTANYL CITRATE (PF) 100 MCG/2ML IJ SOLN
25.0000 ug | Freq: Once | INTRAMUSCULAR | Status: AC
  Administered 2018-02-07: 25 ug via INTRAVENOUS
  Filled 2018-02-07: qty 2

## 2018-02-07 MED ORDER — ONDANSETRON HCL 4 MG/2ML IJ SOLN
4.0000 mg | Freq: Once | INTRAMUSCULAR | Status: AC
  Administered 2018-02-07: 4 mg via INTRAVENOUS
  Filled 2018-02-07: qty 2

## 2018-02-07 MED ORDER — HYDROCODONE-ACETAMINOPHEN 5-325 MG PO TABS: 1.0000 | ORAL_TABLET | Freq: Four times a day (QID) | ORAL | 0 refills | Status: DC | PRN
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
## 2018-02-07 NOTE — ED Notes (Signed)
Patient transported to CT 
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
## 2018-04-25 ENCOUNTER — Ambulatory Visit: Payer: BLUE CROSS/BLUE SHIELD | Admitting: Registered"

## 2018-05-02 ENCOUNTER — Encounter: Payer: BLUE CROSS/BLUE SHIELD | Attending: Nurse Practitioner | Admitting: Registered"

## 2018-05-02 ENCOUNTER — Encounter: Payer: Self-pay | Admitting: Registered"

## 2018-05-02 DIAGNOSIS — I10 Essential (primary) hypertension: Secondary | ICD-10-CM | POA: Insufficient documentation

## 2018-05-02 DIAGNOSIS — Z713 Dietary counseling and surveillance: Secondary | ICD-10-CM | POA: Diagnosis present

## 2018-05-02 DIAGNOSIS — Z6841 Body Mass Index (BMI) 40.0 and over, adult: Secondary | ICD-10-CM | POA: Diagnosis not present

## 2018-05-02 DIAGNOSIS — E669 Obesity, unspecified: Secondary | ICD-10-CM | POA: Insufficient documentation

## 2018-05-02 NOTE — Patient Instructions (Addendum)
-   Aim to eat 3 meals a day.   - Use handout as guide for well-balanced meals.  - Have snacks between meals if needed such as apples and peanut butter.   - Choose whole grain options for bread, rice, and pasta.

## 2018-05-02 NOTE — Progress Notes (Signed)
  Medical Nutrition Therapy:  Appt start time: 3:30 end time: 4:28.  Pt expectations: how to eat  Assessment:  Primary concerns today: wants to lose weight; reports this is her heaviest weight. Pt states she is not happy with the way she looks in the mirror.   Pt states she has 2 children (ages 2 and 9). Pt states she works alternating 12 hour shifts: 6a-6p, 6p-6a, then 2p-10pm, 10p-6am, 6a-2p. Pt states she is thinking about getting another job for more stability. Pt states she has been stressed with work and family. Pt states work is so busy and hot inside that it is hard for her to eat while at work; snacks instead of eating meals until she gets home. Pt states when she gets home she is so hungry, she eats a lot and then goes to bed because she is tired. Pt reports today that although she was off work, she had breakfast this morning, has been eating peppermints throughout the day to avoid eating, and then plans to eat dinner of meat and vegetables but will avoid eating macaroni and cheese. Pt states she drinks protein fruit smoothies from [facility], 1/day. 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 Pt states she tried Lactaid milk and it did not work for her. Pt states she may want to get the lap band surgery to help with weight loss.   Preferred Learning Style:   No preference indicated   Learning Readiness:   Ready  Change in progress   MEDICATIONS: See list   DIETARY INTAKE:  Usual eating pattern includes 1-2 meals and 2-3 snacks per day.  Everyday foods include eggs, oatmeal, chicken, protein fruit smoothies, and vegetables.  Avoided foods include lettuce.    24-hr recall:  B ( AM): 2 boiled eggs + oatmeal  Snk ( AM): none  L (12 PM): peanuts or apples + peanut butter Snk (2 PM): cold fruit D (6 PM): teriyaki chicken, broccoli, macaroni and cheese (will not eat) or baked chicken, spinach  Snk ( PM): none Beverages: water  Usual physical activity: walking and climbing at  work. 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   Estimated energy needs: 1800 calories 200 g carbohydrates 135 g protein 50 g fat  Progress Towards Goal(s):  In progress.   Nutritional Diagnosis:  NI-5.8.4 Inconsistent carbohydrate intake As related to food- and nutrition-related knoweldge deficit concerning appropriate timing of carbohydrate intake.  As evidenced by pt verbalizes incomplete knowledge and recall of skipping meals/avoiding some carbohydrates.    Intervention:  Nutrition education and counseling. Pt was educated and counseled on the importance of eating 3 meals a day, having a variety of food groups with each meal, having nutrient dense snack options between meals if needed. Pt was also educated and counseled on fiber and ways to add fiber into day. Pt was educated and counseled on MyPlate.   Teaching Method Utilized:  Visual Auditory Hands on  Handouts given during visit include:  My Plate  Barriers to learning/adherence to lifestyle change: work-life balance, contemplative stage of change  Demonstrated degree of understanding via:  Teach Back   Monitoring/Evaluation:  Dietary intake, exercise, and body weight in 1 month(s).

## 2018-05-28 ENCOUNTER — Ambulatory Visit: Payer: BLUE CROSS/BLUE SHIELD | Admitting: Registered"

## 2018-07-11 ENCOUNTER — Ambulatory Visit: Payer: Self-pay | Admitting: Podiatry

## 2018-07-19 ENCOUNTER — Ambulatory Visit: Payer: Self-pay | Admitting: Podiatry

## 2018-11-19 ENCOUNTER — Emergency Department (HOSPITAL_COMMUNITY): Payer: BLUE CROSS/BLUE SHIELD

## 2018-11-19 ENCOUNTER — Encounter (HOSPITAL_COMMUNITY): Payer: Self-pay | Admitting: *Deleted

## 2018-11-19 ENCOUNTER — Other Ambulatory Visit: Payer: Self-pay

## 2018-11-19 ENCOUNTER — Emergency Department (HOSPITAL_COMMUNITY)
Admission: EM | Admit: 2018-11-19 | Discharge: 2018-11-19 | Disposition: A | Discharge status: Home / Self Care | Payer: BLUE CROSS/BLUE SHIELD | Attending: Emergency Medicine | Admitting: Emergency Medicine

## 2018-11-19 DIAGNOSIS — R0789 Other chest pain: Secondary | ICD-10-CM | POA: Insufficient documentation

## 2018-11-19 DIAGNOSIS — H538 Other visual disturbances: Secondary | ICD-10-CM | POA: Diagnosis present

## 2018-11-19 DIAGNOSIS — Z79899 Other long term (current) drug therapy: Secondary | ICD-10-CM | POA: Diagnosis not present

## 2018-11-19 DIAGNOSIS — M545 Low back pain, unspecified: Secondary | ICD-10-CM

## 2018-11-19 DIAGNOSIS — R079 Chest pain, unspecified: Secondary | ICD-10-CM

## 2018-11-19 LAB — CBC
HEMATOCRIT: 41 % (ref 36.0–46.0)
Hemoglobin: 12.6 g/dL (ref 12.0–15.0)
MCH: 27.8 pg (ref 26.0–34.0)
MCHC: 30.7 g/dL (ref 30.0–36.0)
MCV: 90.3 fL (ref 80.0–100.0)
Platelets: 219 10*3/uL (ref 150–400)
RBC: 4.54 MIL/uL (ref 3.87–5.11)
RDW: 13.9 % (ref 11.5–15.5)
WBC: 12.4 10*3/uL — ABNORMAL HIGH (ref 4.0–10.5)
nRBC: 0 % (ref 0.0–0.2)

## 2018-11-19 LAB — BASIC METABOLIC PANEL
Anion gap: 10 (ref 5–15)
BUN: 7 mg/dL (ref 6–20)
CHLORIDE: 106 mmol/L (ref 98–111)
CO2: 23 mmol/L (ref 22–32)
Calcium: 8.7 mg/dL — ABNORMAL LOW (ref 8.9–10.3)
Creatinine, Ser: 0.98 mg/dL (ref 0.44–1.00)
GFR calc Af Amer: >60 mL/min (ref >60)
GFR calc non Af Amer: >60 mL/min (ref >60)
Glucose, Bld: 90 mg/dL (ref 70–99)
Potassium: 3.7 mmol/L (ref 3.5–5.1)
Sodium: 139 mmol/L (ref 135–145)

## 2018-11-19 LAB — I-STAT TROPONIN, ED: Troponin i, poc: 0 ng/mL (ref 0.00–0.08)

## 2018-11-19 NOTE — ED Provider Notes (Signed)
[facility] DEPT Provider Note   CSN: [id number] Arrival date & time: 11/19/18  2127     History   Chief Complaint Chief Complaint  Patient presents with  . Chest Pain  . Blurred Vision  . Back Pain    lower    HPI Terri Gibson is a 42 y.o. female.  She has no significant past medical history.  She is complaining of some low back pain on the left at the been going on since Monday.Blurred vision and fatigue.  Today she has been experiencing some 3 out of 10 substernal chest pain that is reproduced by moving forward and lying down.  She is never had this before.  She said she googled her symptoms and thinks she might have new onset diabetes.  No shortness of breath no nausea or vomiting or diarrhea no urinary symptoms.  She is had a partial hysterectomy and denies that she could be pregnant.  No recent change in her medications.  The history is provided by the patient.  Chest Pain   This is a new problem. The current episode started 6 to 12 hours ago. The problem occurs constantly. The problem has not changed since onset.The pain is associated with movement. The pain is present in the substernal region. The pain is at a severity of 3/10. The quality of the pain is described as sharp. The pain does not radiate. Associated symptoms include back pain, dizziness and malaise/fatigue. Pertinent negatives include no abdominal pain, no cough, no fever, no headaches, no irregular heartbeat, no leg pain, no lower extremity edema, no nausea, no numbness, no shortness of breath, no sputum production, no syncope, no vomiting and no weakness. She has tried nothing for the symptoms. The treatment provided no relief. There are no known risk factors.  Her family medical history is significant for diabetes.  Back Pain   This is a new problem. The current episode started 2 days ago. The problem occurs constantly. The problem has not changed since onset.The pain is associated  with no known injury. The pain is present in the lumbar spine. The pain does not radiate. The pain is moderate. The symptoms are aggravated by bending and twisting. Associated symptoms include chest pain. Pertinent negatives include no fever, no numbness, no headaches, no abdominal pain, no bowel incontinence, no perianal numbness, no bladder incontinence, no dysuria, no pelvic pain, no leg pain, no paresthesias, no paresis, no tingling and no weakness. She has tried nothing for the symptoms. The treatment provided no relief.    Past Medical History:  Diagnosis Date  . Fibroids   . Gestational diabetes   . Yeast infection     There are no active problems to display for this patient.   Past Surgical History:  Procedure Laterality Date  . ABDOMINAL HYSTERECTOMY       OB History   No obstetric history on file.      Home Medications    Prior to Admission medications   Medication Sig Start Date End Date Taking? Authorizing Provider  acetaminophen (TYLENOL) 500 MG tablet Take 1,000 mg by mouth every 6 (six) hours as needed for headache (pain).    [provider]  Ferrous Gluconate-C-Folic Acid (IRON-C PO) Take by mouth.    [provider]  Ginseng 100 MG CAPS Take by mouth.    [provider]  HYDROcodone-acetaminophen (NORCO/VICODIN) 5-325 MG tablet Take 1-2 tablets by mouth every 6 (six) hours as needed for moderate pain.  Patient not taking: Reported on 05/02/2018 02/07/18   Fredia Sorrow, MD  naproxen (NAPROSYN) 500 MG tablet Take 1 tablet (500 mg total) by mouth 2 (two) times daily. Patient not taking: Reported on 05/02/2018 02/07/18   Fredia Sorrow, MD  ondansetron (ZOFRAN ODT) 8 MG disintegrating tablet 8mg  ODT q4 hours prn nausea Patient not taking: Reported on 07/14/2017 03/27/17   Veryl Speak, MD  ondansetron (ZOFRAN ODT) 8 MG disintegrating tablet 8mg  ODT q4 hours prn nausea Patient not taking: Reported on 07/14/2017 03/27/17   Veryl Speak, MD    Probiotic Product (SOLUBLE FIBER/PROBIOTICS PO) Take by mouth.    [provider]    Family History Family History  Problem Relation Age of Onset  . Diabetes Other   . Hyperlipidemia Other   . Hypertension Other     Social History Social History   Tobacco Use  . Smoking status: Never Smoker  . Smokeless tobacco: Never Used  Substance Use Topics  . Alcohol use: No  . Drug use: No     Allergies   Rabbit protein and Tomato   Review of Systems Review of Systems  Constitutional: Positive for malaise/fatigue. Negative for fever.  HENT: Negative for sore throat.   Eyes: Positive for visual disturbance. Negative for pain.  Respiratory: Negative for cough, sputum production and shortness of breath.   Cardiovascular: Positive for chest pain. Negative for syncope.  Gastrointestinal: Negative for abdominal pain, bowel incontinence, nausea and vomiting.  Genitourinary: Negative for bladder incontinence, dysuria and pelvic pain.  Musculoskeletal: Positive for back pain. Negative for neck pain.  Skin: Negative for rash.  Neurological: Positive for dizziness. Negative for tingling, weakness, numbness, headaches and paresthesias.     Physical Exam Updated Vital Signs BP 129/80 (BP Location: Left Arm)   Pulse 89   Temp 98.7 F (37.1 C) (Oral)   Resp 18   Ht 5\' 3"  (1.6 m)   Wt 98.4 kg   SpO2 100%   BMI 38.44 kg/m   Physical Exam Vitals signs and nursing note reviewed.  Constitutional:      General: She is not in acute distress.    Appearance: She is well-developed.  HENT:     Head: Normocephalic and atraumatic.  Eyes:     Extraocular Movements: Extraocular movements intact.     Conjunctiva/sclera: Conjunctivae normal.     Pupils: Pupils are equal, round, and reactive to light.  Neck:     Musculoskeletal: Neck supple.  Cardiovascular:     Rate and Rhythm: Normal rate and regular rhythm.     Heart sounds: No murmur.  Pulmonary:     Effort: Pulmonary  effort is normal. No respiratory distress.     Breath sounds: Normal breath sounds.  Abdominal:     Palpations: Abdomen is soft.     Tenderness: There is no abdominal tenderness. There is no guarding.  Musculoskeletal: Normal range of motion.     Right lower leg: She exhibits no tenderness.     Left lower leg: She exhibits no tenderness.  Skin:    General: Skin is warm and dry.     Capillary Refill: Capillary refill takes less than 2 seconds.  Neurological:     General: No focal deficit present.     Mental Status: She is alert and oriented to person, place, and time.     Cranial Nerves: No cranial nerve deficit.     Motor: No weakness.      ED Treatments / Results  Labs (all  labs ordered are listed, but only abnormal results are displayed) Labs Reviewed  BASIC METABOLIC PANEL - Abnormal; Notable for the following components:      Result Value   Calcium 8.7 (*)    All other components within normal limits  CBC - Abnormal; Notable for the following components:   WBC 12.4 (*)    All other components within normal limits  I-STAT TROPONIN, ED    EKG EKG Interpretation  Date/Time:  Tuesday November 19 2018 21:41:30 EST Ventricular Rate:  85 PR Interval:    QRS Duration: 86 QT Interval:  368 QTC Calculation: 438 R Axis:   16 Text Interpretation:  Sinus rhythm Borderline T abnormalities, anterior leads similar to prior 3/19 Confirmed by Aletta Edouard [phone]) on 11/19/2018 9:45:16 PM   Radiology Dg Chest 2 View  Result Date: 11/19/2018 CLINICAL DATA:  Chest pain, dizziness and blurry vision, palpitations. EXAM: CHEST - 2 VIEW COMPARISON:  CT chest February 07, 2018 FINDINGS: Cardiomediastinal silhouette is normal. No pleural effusions or focal consolidations. Trachea projects midline and there is no pneumothorax. Soft tissue planes and included osseous structures are non-suspicious. IMPRESSION: Normal chest radiograph. Electronically Signed   By: Elon Alas M.D.   On:  11/19/2018 22:33    Procedures Procedures (including critical care time)  Medications Ordered in ED Medications - No data to display   Initial Impression / Assessment and Plan / ED Course  I have reviewed the triage vital signs and the nursing notes.  Pertinent labs & imaging results that were available during my care of the patient were reviewed by me and considered in my medical decision making (see chart for details).  Clinical Course as of Nov 20 2323  Tue Nov 19, 2018  2236 Patient with some nonspecific low back pain without urinary symptoms.  More atypical chest pain today along with a few days of some blurred vision.  She is mostly concerned that she could be diabetic because she googled this and diabetes runs in her family.  She is very well-appearing.  EKG and chest x-ray benign.  First troponin negative.  I do not think she will need serial tropes as the symptoms have been going on greater than 6 hours.   [MB]  2320 Patient's lab work was unremarkable.  I reviewed them with her and she is comfortable with going home.  I explained that she needs to keep an eye of her symptoms because this may be the beginning of something but nothing on her testing now was explained her symptoms.   [MB]    Clinical Course User Index [MB] Hayden Rasmussen, MD      Final Clinical Impressions(s) / ED Diagnoses   Final diagnoses:  Blurred vision  Acute left-sided low back pain without sciatica  Nonspecific chest pain    ED Discharge Orders    None       Hayden Rasmussen, MD 11/19/18 2324

## 2018-11-19 NOTE — ED Triage Notes (Signed)
Pt c/o chest pain that started today, mid-sternal, are sharp, having blurred vision, fatigue and lower back pain.  Pt denies n/v.

## 2018-11-19 NOTE — ED Notes (Signed)
ED Provider at bedside. 

## 2018-11-19 NOTE — Discharge Instructions (Addendum)
You were seen in the emergency department for some low back pain, blurry vision, fatigue, dizziness, and chest pain.  You had blood work and an EKG and chest x-ray that show an obvious explanation for your symptoms.  Please stay well-hydrated and use Tylenol and ibuprofen.  It will be important for you to follow-up with your primary care doctor but please return if any worsening symptoms.

## 2019-01-18 ENCOUNTER — Encounter (HOSPITAL_COMMUNITY): Payer: Self-pay | Admitting: *Deleted

## 2019-01-18 ENCOUNTER — Ambulatory Visit (HOSPITAL_COMMUNITY)
Admission: EM | Admit: 2019-01-18 | Discharge: 2019-01-18 | Disposition: A | Discharge status: Home / Self Care | Payer: BLUE CROSS/BLUE SHIELD | Attending: Family Medicine | Admitting: Family Medicine

## 2019-01-18 ENCOUNTER — Other Ambulatory Visit: Payer: Self-pay

## 2019-01-18 DIAGNOSIS — S39012A Strain of muscle, fascia and tendon of lower back, initial encounter: Secondary | ICD-10-CM

## 2019-01-18 DIAGNOSIS — X500XXA Overexertion from strenuous movement or load, initial encounter: Secondary | ICD-10-CM | POA: Diagnosis not present

## 2019-01-18 MED ORDER — TRAMADOL HCL 50 MG PO TABS: 50.0000 mg | ORAL_TABLET | Freq: Four times a day (QID) | ORAL | 0 refills | Status: DC | PRN

## 2019-01-18 MED ORDER — METHYLPREDNISOLONE 4 MG PO TBPK: ORAL_TABLET | ORAL | 0 refills | Status: DC

## 2019-01-18 MED ORDER — CYCLOBENZAPRINE HCL 5 MG PO TABS: 5.0000 mg | ORAL_TABLET | Freq: Every day | ORAL | 0 refills | Status: DC

## 2019-01-18 NOTE — ED Triage Notes (Signed)
States she has been having back pain off and on x 1 year. States it started hurting on Thurs.

## 2019-01-18 NOTE — ED Provider Notes (Signed)
[name]    CSN: [id number] Arrival date & time: 01/18/19  1400     History   Chief Complaint Chief Complaint  Patient presents with  . Back Pain    HPI Terri Gibson is a 43 y.o. female.   Terri Gibson presents with complaints of bilateral low back pain after lifting and moving boxes two days ago. Has had back injury in the past which was treated with physical therapy. Tried taking aleve and meloxicam and has had minimal relief of pain. Pain is worse with sitting or twisting. No numbness tingling, no saddle paresthesias, no loss of bladder or bowel function, no radiation into thigh. Pain was "20/10" and is now 10/10 in severity. No abdominal pain. History of fibroids, GDM.    ROS per HPI.      Past Medical History:  Diagnosis Date  . Fibroids   . Gestational diabetes   . Yeast infection     There are no active problems to display for this patient.   Past Surgical History:  Procedure Laterality Date  . ABDOMINAL HYSTERECTOMY      OB History   No obstetric history on file.      Home Medications    Prior to Admission medications   Medication Sig Start Date End Date Taking? Authorizing Provider  cyclobenzaprine (FLEXERIL) 5 MG tablet Take 1 tablet (5 mg total) by mouth at bedtime. 01/18/19   Augusto Gamble B, NP  methylPREDNISolone (MEDROL DOSEPAK) 4 MG TBPK tablet Per box instructions 01/18/19   Augusto Gamble B, NP  naproxen sodium (ALEVE) 220 MG tablet Take 440 mg by mouth daily as needed (pain).    [provider]  traMADol (ULTRAM) 50 MG tablet Take 1 tablet (50 mg total) by mouth every 6 (six) hours as needed for severe pain. 01/18/19   Zigmund Gottron, NP    Family History Family History  Problem Relation Age of Onset  . Diabetes Other   . Hyperlipidemia Other   . Hypertension Other     Social History Social History   Tobacco Use  . Smoking status: Never Smoker  . Smokeless tobacco: Never Used  Substance Use Topics  .  Alcohol use: No  . Drug use: No     Allergies   Hydrocodone-ibuprofen; Rabbit protein; and Tomato   Review of Systems Review of Systems   Physical Exam Triage Vital Signs ED Triage Vitals  Enc Vitals Group     BP 01/18/19 1448 (!) 144/89     Pulse Rate 01/18/19 1448 76     Resp 01/18/19 1448 18     Temp 01/18/19 1448 98.2 F (36.8 C)     Temp Source 01/18/19 1448 Oral     SpO2 01/18/19 1448 98 %     Weight --      Height --      Head Circumference --      Peak Flow --      Pain Score 01/18/19 1450 8     Pain Loc --      Pain Edu? --      Excl. in [place]? --    No data found.  Updated Vital Signs BP (!) 144/89 (BP Location: Left Arm)   Pulse 76   Temp 98.2 F (36.8 C) (Oral)   Resp 18   SpO2 98%    Physical Exam Constitutional:      General: She is not in acute distress.    Appearance: She is well-developed.  Cardiovascular:     Rate and Rhythm: Normal rate and regular rhythm.     Heart sounds: Normal heart sounds.  Pulmonary:     Effort: Pulmonary effort is normal.     Breath sounds: Normal breath sounds.  Musculoskeletal:     Lumbar back: She exhibits tenderness and pain. She exhibits normal range of motion, no bony tenderness, no swelling, no edema, no deformity and no laceration.     Comments: Bilateral low back tenderness and pain with hip flexion and with straight leg raise; strength equal bilaterally; gross sensation intact to lower extremities; no difficulty with ambulation; no bony tenderness, step off or deformity; pain with transition from sit to lay and lay to sit   Skin:    General: Skin is warm and dry.  Neurological:     Mental Status: She is alert and oriented to person, place, and time.      UC Treatments / Results  Labs (all labs ordered are listed, but only abnormal results are displayed) Labs Reviewed - No data to display  EKG None  Radiology No results found.  Procedures Procedures (including critical care  time)  Medications Ordered in UC Medications - No data to display  Initial Impression / Assessment and Plan / UC Course  I have reviewed the triage vital signs and the nursing notes.  Pertinent labs & imaging results that were available during my care of the patient were reviewed by me and considered in my medical decision making (see chart for details).     Low back strain. No red flag findings. Pain management discussed. Continue to follow with PCP for management as needed. Return precautions provided. Patient verbalized understanding and agreeable to plan.  Ambulatory out of clinic without difficulty.    Final Clinical Impressions(s) / UC Diagnoses   Final diagnoses:  Strain of lumbar region, initial encounter     Discharge Instructions     Complete steroid dose pack. Don't take meloxicam or naproxen while taking, may resume one of these, not both, once steroid pack completed.  May use muscle relaxer at night. Tramdol for breakthrough pain, don't take both at once. Both may cause drowsiness. Please do not take if driving or drinking alcohol.   Light and regular activity as tolerated. Limit any heavy lifting.  Please follow up with your primary care provider for further evaluation and treatment for persistent symptoms.    ED Prescriptions    Medication Sig Dispense Auth. Provider   methylPREDNISolone (MEDROL DOSEPAK) 4 MG TBPK tablet Per box instructions 21 tablet Dao Memmott B, NP   cyclobenzaprine (FLEXERIL) 5 MG tablet Take 1 tablet (5 mg total) by mouth at bedtime. 15 tablet Augusto Gamble B, NP   traMADol (ULTRAM) 50 MG tablet Take 1 tablet (50 mg total) by mouth every 6 (six) hours as needed for severe pain. 5 tablet Zigmund Gottron, NP     Controlled Substance Prescriptions [place] Controlled Substance Registry consulted? Not Applicable   Zigmund Gottron, NP 01/18/19 1525

## 2019-01-18 NOTE — Discharge Instructions (Signed)
Complete steroid dose pack. Don't take meloxicam or naproxen while taking, may resume one of these, not both, once steroid pack completed.  May use muscle relaxer at night. Tramdol for breakthrough pain, don't take both at once. Both may cause drowsiness. Please do not take if driving or drinking alcohol.   Light and regular activity as tolerated. Limit any heavy lifting.  Please follow up with your primary care provider for further evaluation and treatment for persistent symptoms.

## 2019-08-14 ENCOUNTER — Other Ambulatory Visit: Payer: Self-pay

## 2019-08-14 DIAGNOSIS — Z20822 Contact with and (suspected) exposure to covid-19: Secondary | ICD-10-CM

## 2019-08-16 LAB — NOVEL CORONAVIRUS, NAA: SARS-CoV-2, NAA: NOT DETECTED

## 2019-10-28 ENCOUNTER — Other Ambulatory Visit: Payer: Self-pay

## 2019-10-28 DIAGNOSIS — Z20822 Contact with and (suspected) exposure to covid-19: Secondary | ICD-10-CM

## 2019-10-29 LAB — NOVEL CORONAVIRUS, NAA: SARS-CoV-2, NAA: NOT DETECTED

## 2019-12-05 ENCOUNTER — Other Ambulatory Visit: Payer: Medicaid Other

## 2019-12-08 ENCOUNTER — Ambulatory Visit: Payer: Medicaid Other | Attending: Internal Medicine

## 2019-12-08 DIAGNOSIS — Z20822 Contact with and (suspected) exposure to covid-19: Secondary | ICD-10-CM

## 2019-12-09 LAB — NOVEL CORONAVIRUS, NAA: SARS-CoV-2, NAA: NOT DETECTED

## 2020-07-14 ENCOUNTER — Ambulatory Visit (HOSPITAL_COMMUNITY)
Admission: EM | Admit: 2020-07-14 | Discharge: 2020-07-14 | Disposition: A | Discharge status: Home / Self Care | Payer: BC Managed Care – PPO | Attending: Family Medicine | Admitting: Family Medicine

## 2020-07-14 ENCOUNTER — Other Ambulatory Visit: Payer: Self-pay

## 2020-07-14 ENCOUNTER — Encounter (HOSPITAL_COMMUNITY): Payer: Self-pay

## 2020-07-14 DIAGNOSIS — R509 Fever, unspecified: Secondary | ICD-10-CM | POA: Insufficient documentation

## 2020-07-14 DIAGNOSIS — R059 Cough, unspecified: Secondary | ICD-10-CM

## 2020-07-14 DIAGNOSIS — U071 COVID-19: Secondary | ICD-10-CM | POA: Insufficient documentation

## 2020-07-14 DIAGNOSIS — R05 Cough: Secondary | ICD-10-CM | POA: Diagnosis present

## 2020-07-14 DIAGNOSIS — K529 Noninfective gastroenteritis and colitis, unspecified: Secondary | ICD-10-CM | POA: Insufficient documentation

## 2020-07-14 LAB — SARS CORONAVIRUS 2 (TAT 6-24 HRS): SARS Coronavirus 2: POSITIVE — AB

## 2020-07-14 MED ORDER — ONDANSETRON 4 MG PO TBDP: 4.0000 mg | ORAL_TABLET | Freq: Three times a day (TID) | ORAL | 0 refills | Status: AC | PRN

## 2020-07-14 MED ORDER — ACETAMINOPHEN 325 MG PO TABS
ORAL_TABLET | ORAL | Status: AC
  Filled 2020-07-14: qty 2

## 2020-07-14 MED ORDER — ACETAMINOPHEN 325 MG PO TABS
650.0000 mg | ORAL_TABLET | Freq: Once | ORAL | Status: AC
  Administered 2020-07-14: 650 mg via ORAL

## 2020-07-14 MED ORDER — ONDANSETRON 4 MG PO TBDP
4.0000 mg | ORAL_TABLET | Freq: Once | ORAL | Status: AC
  Administered 2020-07-14: 4 mg via ORAL

## 2020-07-14 MED ORDER — ONDANSETRON 4 MG PO TBDP
ORAL_TABLET | ORAL | Status: AC
  Filled 2020-07-14: qty 1

## 2020-07-14 NOTE — ED Provider Notes (Signed)
[facility]   [id number] 07/14/20 Arrival Time: 1005  ASSESSMENT & PLAN:  1. Gastroenteritis   2. Fever, unspecified fever cause   3. Cough      COVID-19 testing sent. See letter/work note on file for self-isolation guidelines. Fever control.  Meds ordered this encounter  Medications  . acetaminophen (TYLENOL) tablet 650 mg  . ondansetron (ZOFRAN-ODT) disintegrating tablet 4 mg  . ondansetron (ZOFRAN-ODT) 4 MG disintegrating tablet    Sig: Take 1 tablet (4 mg total) by mouth every 8 (eight) hours as needed for nausea or vomiting.    Dispense:  15 tablet    Refill:  0      Discharge Instructions     You have been tested for COVID-19 today. If your test returns positive, you will receive a phone call from [facility] regarding your results. Negative test results are not called. Both positive and negative results area always visible on MyChart. If you do not have a MyChart account, sign up instructions are provided in your discharge papers. Please do not hesitate to contact us should you have questions or concerns.  Please do your best to ensure adequate fluid intake in order to avoid dehydration. If you find that you are unable to tolerate drinking fluids regularly please proceed to the Emergency Department for evaluation.  Also, you should return to the hospital if you experience persistent fevers for greater than 1-2 more days, increasing abdominal pain that persists despite medications, persistent diarrhea, dizziness, syncope (fainting), or for any other concerns you may deem worrisome.     Agrees to ED eval if worsening.  Reviewed expectations re: course of current medical issues. Questions answered. Outlined signs and symptoms indicating need for more acute intervention. Understanding verbalized. After Visit Summary given.   SUBJECTIVE: History from: patient. Terri Gibson is a 44 y.o. female who reports n/v/d, loss of taste, body aches, subj  fever, and fatigue. Abrupt onset approx 3 d ago. Recent travel: None. Denies: difficulty breathing. Decreased PO intake overall. No specific abdominal or back pain reported. Ambulatory without difficulty.    OBJECTIVE:  Vitals:   07/14/20 1121  BP: 132/85  Pulse: (!) 118  Resp: 18  Temp: (!) 102.3 F (39.1 C)  TempSrc: Oral  SpO2: 100%  Weight: 104.3 kg  Height: 5' 5.5" (1.664 m)    Temp noted.  General appearance: alert; no distress; appears fatigued Eyes: PERRLA; EOMI; conjunctiva normal HENT: [place]; AT; nasal congesiton Neck: supple  CV: tachycardic; regular Lungs: speaks full sentences without difficulty; unlabored Extremities: no edema Skin: warm and dry Neurologic: normal gait Psychological: alert and cooperative; normal mood and affect  Labs:  Labs Reviewed  SARS CORONAVIRUS 2 (TAT 6-24 HRS)      Allergies  Allergen Reactions  . Hydrocodone-Ibuprofen Itching  . Rabbit Protein Swelling    Can't eat made whole body swell   . Tomato Hives    Past Medical History:  Diagnosis Date  . Fibroids   . Gestational diabetes   . Yeast infection    Social History   Socioeconomic History  . Marital status: Single    Spouse name: Not on file  . Number of children: Not on file  . Years of education: Not on file  . Highest education level: Not on file  Occupational History  . Not on file  Tobacco Use  . Smoking status: Never Smoker  . Smokeless tobacco: Never Used  Substance and Sexual Activity  . Alcohol use: No  .  Drug use: No  . Sexual activity: Not Currently    Birth control/protection: Surgical  Other Topics Concern  . Not on file  Social History Narrative  . Not on file   Social Determinants of Health   Financial Resource Strain:   . Difficulty of Paying Living Expenses:   Food Insecurity:   . Worried About Charity fundraiser in the Last Year:   . Arboriculturist in the Last Year:   Transportation Needs:   . Film/video editor  (Medical):   [facility] Lack of Transportation (Non-Medical):   Physical Activity:   . Days of Exercise per Week:   . Minutes of Exercise per Session:   Stress:   . Feeling of Stress :   Social Connections:   . Frequency of Communication with Friends and Family:   . Frequency of Social Gatherings with Friends and Family:   . Attends Religious Services:   . Active Member of Clubs or Organizations:   . Attends Archivist Meetings:   Marland Kitchen Marital Status:   Intimate Partner Violence:   . Fear of Current or Ex-Partner:   . Emotionally Abused:   [facility] Kitchen Physically Abused:   . Sexually Abused:    Family History  Problem Relation Age of Onset  . Diabetes Other   . Hyperlipidemia Other   . Hypertension Other    Past Surgical History:  Procedure Laterality Date  . ABDOMINAL HYSTERECTOMY       Vanessa Kick, MD 07/14/20 1351

## 2020-07-14 NOTE — ED Triage Notes (Signed)
Pt c/o diarrhea, nausea, loss of taste, body aches, chills and abd painsx3 days. Pt states 5 bouts of diarrhea in past 3 days.

## 2020-07-14 NOTE — Discharge Instructions (Addendum)
You have been tested for COVID-19 today. If your test returns positive, you will receive a phone call from [facility] regarding your results. Negative test results are not called. Both positive and negative results area always visible on MyChart. If you do not have a MyChart account, sign up instructions are provided in your discharge papers. Please do not hesitate to contact us should you have questions or concerns.  Please do your best to ensure adequate fluid intake in order to avoid dehydration. If you find that you are unable to tolerate drinking fluids regularly please proceed to the Emergency Department for evaluation.  Also, you should return to the hospital if you experience persistent fevers for greater than 1-2 more days, increasing abdominal pain that persists despite medications, persistent diarrhea, dizziness, syncope (fainting), or for any other concerns you may deem worrisome.

## 2020-07-17 ENCOUNTER — Encounter (HOSPITAL_COMMUNITY): Payer: Self-pay

## 2020-07-17 ENCOUNTER — Emergency Department (HOSPITAL_COMMUNITY)
Admission: EM | Admit: 2020-07-17 | Discharge: 2020-07-18 | Disposition: A | Discharge status: Home / Self Care | Payer: BC Managed Care – PPO | Attending: Emergency Medicine | Admitting: Emergency Medicine

## 2020-07-17 ENCOUNTER — Other Ambulatory Visit: Payer: Self-pay

## 2020-07-17 DIAGNOSIS — R05 Cough: Secondary | ICD-10-CM | POA: Insufficient documentation

## 2020-07-17 DIAGNOSIS — R0981 Nasal congestion: Secondary | ICD-10-CM | POA: Diagnosis not present

## 2020-07-17 DIAGNOSIS — Z5321 Procedure and treatment not carried out due to patient leaving prior to being seen by health care provider: Secondary | ICD-10-CM | POA: Insufficient documentation

## 2020-07-17 MED ORDER — ACETAMINOPHEN 325 MG PO TABS
650.0000 mg | ORAL_TABLET | Freq: Once | ORAL | Status: AC | PRN
  Administered 2020-07-17: 650 mg via ORAL
  Filled 2020-07-17: qty 2

## 2020-07-17 NOTE — ED Triage Notes (Signed)
Patient complains of ongoing cough and congestion x 4 days. Has been taking tylenol with some relief. Patient alert and oriented, NAD

## 2020-07-21 ENCOUNTER — Other Ambulatory Visit: Payer: Self-pay

## 2020-07-21 ENCOUNTER — Emergency Department (HOSPITAL_COMMUNITY): Payer: BC Managed Care – PPO

## 2020-07-21 ENCOUNTER — Encounter (HOSPITAL_COMMUNITY): Payer: Self-pay | Admitting: Emergency Medicine

## 2020-07-21 ENCOUNTER — Inpatient Hospital Stay (HOSPITAL_COMMUNITY)
Admission: EM | Admit: 2020-07-21 | Discharge: 2020-07-25 | DRG: 177 | Disposition: A | Discharge status: Home / Self Care | Payer: BC Managed Care – PPO | Attending: Internal Medicine | Admitting: Internal Medicine

## 2020-07-21 DIAGNOSIS — J1282 Pneumonia due to coronavirus disease 2019: Secondary | ICD-10-CM | POA: Diagnosis present

## 2020-07-21 DIAGNOSIS — U071 COVID-19: Secondary | ICD-10-CM | POA: Diagnosis present

## 2020-07-21 DIAGNOSIS — Z885 Allergy status to narcotic agent status: Secondary | ICD-10-CM | POA: Diagnosis not present

## 2020-07-21 DIAGNOSIS — Z833 Family history of diabetes mellitus: Secondary | ICD-10-CM

## 2020-07-21 DIAGNOSIS — J9601 Acute respiratory failure with hypoxia: Secondary | ICD-10-CM | POA: Diagnosis present

## 2020-07-21 DIAGNOSIS — Z6838 Body mass index (BMI) 38.0-38.9, adult: Secondary | ICD-10-CM

## 2020-07-21 DIAGNOSIS — Z91018 Allergy to other foods: Secondary | ICD-10-CM | POA: Diagnosis not present

## 2020-07-21 DIAGNOSIS — E669 Obesity, unspecified: Secondary | ICD-10-CM | POA: Diagnosis present

## 2020-07-21 DIAGNOSIS — G4700 Insomnia, unspecified: Secondary | ICD-10-CM | POA: Diagnosis present

## 2020-07-21 DIAGNOSIS — F419 Anxiety disorder, unspecified: Secondary | ICD-10-CM | POA: Diagnosis not present

## 2020-07-21 DIAGNOSIS — J9600 Acute respiratory failure, unspecified whether with hypoxia or hypercapnia: Secondary | ICD-10-CM | POA: Diagnosis present

## 2020-07-21 DIAGNOSIS — Z8632 Personal history of gestational diabetes: Secondary | ICD-10-CM

## 2020-07-21 DIAGNOSIS — J069 Acute upper respiratory infection, unspecified: Secondary | ICD-10-CM | POA: Diagnosis present

## 2020-07-21 LAB — CBC WITH DIFFERENTIAL/PLATELET
Abs Immature Granulocytes: 0.02 10*3/uL (ref 0.00–0.07)
Basophils Absolute: 0 10*3/uL (ref 0.0–0.1)
Basophils Relative: 0 %
Eosinophils Absolute: 0 10*3/uL (ref 0.0–0.5)
Eosinophils Relative: 0 %
HCT: 41 % (ref 36.0–46.0)
Hemoglobin: 13 g/dL (ref 12.0–15.0)
Immature Granulocytes: 0 %
Lymphocytes Relative: 28 %
Lymphs Abs: 1.8 10*3/uL (ref 0.7–4.0)
MCH: 27.1 pg (ref 26.0–34.0)
MCHC: 31.7 g/dL (ref 30.0–36.0)
MCV: 85.4 fL (ref 80.0–100.0)
Monocytes Absolute: 0.4 10*3/uL (ref 0.1–1.0)
Monocytes Relative: 6 %
Neutro Abs: 4.1 10*3/uL (ref 1.7–7.7)
Neutrophils Relative %: 66 %
Platelets: 252 10*3/uL (ref 150–400)
RBC: 4.8 MIL/uL (ref 3.87–5.11)
RDW: 14.5 % (ref 11.5–15.5)
WBC: 6.3 10*3/uL (ref 4.0–10.5)
nRBC: 0 % (ref 0.0–0.2)

## 2020-07-21 LAB — COMPREHENSIVE METABOLIC PANEL
ALT: 22 U/L (ref 0–44)
AST: 29 U/L (ref 15–41)
Albumin: 3.9 g/dL (ref 3.5–5.0)
Alkaline Phosphatase: 55 U/L (ref 38–126)
Anion gap: 13 (ref 5–15)
BUN: 9 mg/dL (ref 6–20)
CO2: 26 mmol/L (ref 22–32)
Calcium: 8.5 mg/dL — ABNORMAL LOW (ref 8.9–10.3)
Chloride: 99 mmol/L (ref 98–111)
Creatinine, Ser: 0.78 mg/dL (ref 0.44–1.00)
GFR calc Af Amer: >60 mL/min (ref >60)
GFR calc non Af Amer: >60 mL/min (ref >60)
Glucose, Bld: 95 mg/dL (ref 70–99)
Potassium: 4 mmol/L (ref 3.5–5.1)
Sodium: 138 mmol/L (ref 135–145)
Total Bilirubin: 0.7 mg/dL (ref 0.3–1.2)
Total Protein: 7.7 g/dL (ref 6.5–8.1)

## 2020-07-21 LAB — LACTATE DEHYDROGENASE: LDH: 321 U/L — ABNORMAL HIGH (ref 98–192)

## 2020-07-21 LAB — D-DIMER, QUANTITATIVE: D-Dimer, Quant: 0.98 ug/mL-FEU — ABNORMAL HIGH (ref 0.00–0.50)

## 2020-07-21 LAB — HCG, QUANTITATIVE, PREGNANCY: hCG, Beta Chain, Quant, S: <1 m[IU]/mL (ref <5)

## 2020-07-21 LAB — TRIGLYCERIDES: Triglycerides: 102 mg/dL (ref <150)

## 2020-07-21 LAB — LACTIC ACID, PLASMA: Lactic Acid, Venous: 1.3 mmol/L (ref 0.5–1.9)

## 2020-07-21 LAB — C-REACTIVE PROTEIN: CRP: 14.2 mg/dL — ABNORMAL HIGH (ref <1.0)

## 2020-07-21 LAB — PROCALCITONIN: Procalcitonin: <0.1 ng/mL

## 2020-07-21 LAB — FIBRINOGEN: Fibrinogen: 694 mg/dL — ABNORMAL HIGH (ref 210–475)

## 2020-07-21 LAB — FERRITIN: Ferritin: 558 ng/mL — ABNORMAL HIGH (ref 11–307)

## 2020-07-21 MED ORDER — METHYLPREDNISOLONE SODIUM SUCC 125 MG IJ SOLR
0.5000 mg/kg | Freq: Once | INTRAMUSCULAR | Status: AC
  Administered 2020-07-21: 51.875 mg via INTRAVENOUS
  Filled 2020-07-21: qty 2

## 2020-07-21 MED ORDER — ONDANSETRON HCL 4 MG/2ML IJ SOLN: 4.0000 mg | Freq: Four times a day (QID) | INTRAMUSCULAR | Status: DC | PRN

## 2020-07-21 MED ORDER — GUAIFENESIN-DM 100-10 MG/5ML PO SYRP
10.0000 mL | ORAL_SOLUTION | ORAL | Status: DC | PRN
  Administered 2020-07-21 – 2020-07-24 (×3): 10 mL via ORAL
  Filled 2020-07-21 (×3): qty 10

## 2020-07-21 MED ORDER — SODIUM CHLORIDE 0.9 % IV SOLN
200.0000 mg | Freq: Once | INTRAVENOUS | Status: AC
  Administered 2020-07-21: 200 mg via INTRAVENOUS
  Filled 2020-07-21: qty 200

## 2020-07-21 MED ORDER — ASCORBIC ACID 500 MG PO TABS
500.0000 mg | ORAL_TABLET | Freq: Every day | ORAL | Status: DC
  Administered 2020-07-21 – 2020-07-25 (×5): 500 mg via ORAL
  Filled 2020-07-21 (×5): qty 1

## 2020-07-21 MED ORDER — ZINC SULFATE 220 (50 ZN) MG PO CAPS
220.0000 mg | ORAL_CAPSULE | Freq: Every day | ORAL | Status: DC
  Administered 2020-07-21 – 2020-07-25 (×5): 220 mg via ORAL
  Filled 2020-07-21 (×5): qty 1

## 2020-07-21 MED ORDER — SODIUM CHLORIDE 0.9 % IV SOLN
100.0000 mg | Freq: Every day | INTRAVENOUS | Status: AC
  Administered 2020-07-22 – 2020-07-25 (×4): 100 mg via INTRAVENOUS
  Filled 2020-07-21 (×4): qty 20

## 2020-07-21 MED ORDER — ENOXAPARIN SODIUM 40 MG/0.4ML ~~LOC~~ SOLN
40.0000 mg | Freq: Every day | SUBCUTANEOUS | Status: DC
  Administered 2020-07-21 – 2020-07-22 (×2): 40 mg via SUBCUTANEOUS
  Filled 2020-07-21 (×2): qty 0.4

## 2020-07-21 MED ORDER — MORPHINE SULFATE (PF) 4 MG/ML IV SOLN
4.0000 mg | Freq: Once | INTRAVENOUS | Status: AC
  Administered 2020-07-21: 4 mg via INTRAVENOUS
  Filled 2020-07-21: qty 1

## 2020-07-21 MED ORDER — METHYLPREDNISOLONE SODIUM SUCC 125 MG IJ SOLR
0.5000 mg/kg | Freq: Two times a day (BID) | INTRAMUSCULAR | Status: DC
  Administered 2020-07-22: 51.875 mg via INTRAVENOUS
  Filled 2020-07-21: qty 2

## 2020-07-21 MED ORDER — ONDANSETRON HCL 4 MG PO TABS: 4.0000 mg | ORAL_TABLET | Freq: Four times a day (QID) | ORAL | Status: DC | PRN

## 2020-07-21 NOTE — ED Triage Notes (Signed)
Per [facility] pt comes in from UC Covid+ O2 was 88-90 on room air, placed on 6L O2 via nasal canula. EMS reports O2 on 3L was 99%. Pt positive on 8/18.

## 2020-07-21 NOTE — H&P (Signed)
History and Physical    Terri Gibson:[id number] DOB: December 15, 1975 DOA: 07/21/2020  PCP: Patient, [name]  Patient coming from: Home.  Chief Complaint: Shortness of breath.  HPI: Terri Gibson is a 44 y.o. female with no significant past medical history presents to the ER with complaints of shortness of breath.  Patient was diagnosed with COVID-19 infection on 07/14/2020 at the time patient symptoms were headache and upper respiratory symptoms.  Gradually patient became more short of breath with nonproductive cough even at rest.  During the initial.  Patient had some nausea and vomiting which has subsided now.  ED Course: In the ER patient was hypoxic requiring 3 L oxygen with chest x-ray showing infiltrates.  Patient was afebrile with CRP of 14.2 - procalcitonin D-dimer 0.98 EKG showing normal sinus rhythm patient admitted for acute respiratory failure with hypoxia secondary to COVID-19 infection.  Review of Systems: As per HPI, rest all negative.   Past Medical History:  Diagnosis Date  . Fibroids   . Gestational diabetes   . Yeast infection     Past Surgical History:  Procedure Laterality Date  . ABDOMINAL HYSTERECTOMY       reports that she has never smoked. She has never used smokeless tobacco. She reports that she does not drink alcohol and does not use drugs.  Allergies  Allergen Reactions  . Hydrocodone-Ibuprofen Itching  . Rabbit Protein Swelling    Can't eat made whole body swell   . Tomato Hives    Family History  Problem Relation Age of Onset  . Diabetes Other   . Hyperlipidemia Other   . Hypertension Other     Prior to Admission medications   Medication Sig Start Date End Date Taking? Authorizing Provider  acetaminophen (TYLENOL) 500 MG tablet Take 500 mg by mouth every 6 (six) hours as needed for moderate pain, fever or headache.   Yes [provider]  naproxen sodium (ALEVE) 220 MG tablet Take 440 mg by mouth daily as needed (pain).    Yes [provider]  ondansetron (ZOFRAN-ODT) 4 MG disintegrating tablet Take 1 tablet (4 mg total) by mouth every 8 (eight) hours as needed for nausea or vomiting. 07/14/20  Yes Vanessa Kick, MD    Physical Exam: Constitutional: Moderately built and nourished. Vitals:   07/21/20 1704 07/21/20 2000 07/21/20 2100  BP: 127/81 126/86 112/81  Pulse: (!) 102 88 89  Resp: (!) 33 (!) 28 (!) 30  Temp: 98.4 F (36.9 C)    TempSrc: Oral    SpO2: 92% 100% 100%   Eyes: Anicteric no pallor. ENMT: No discharge from the ears eyes nose or mouth. Neck: No mass felt.  No neck rigidity. Respiratory: No rhonchi or crepitations. Cardiovascular: S1-S2 heard. Abdomen: Soft nontender bowel sounds present. Musculoskeletal: No edema. Skin: No rash. Neurologic: Alert awake oriented to time place and person.  Moves all extremities. Psychiatric: Appears normal.  Normal affect.   Labs on Admission: I have personally reviewed following labs and imaging studies  CBC: Recent Labs  Lab 07/21/20 1957  WBC 6.3  NEUTROABS 4.1  HGB 13.0  HCT 41.0  MCV 85.4  PLT 973   Basic Metabolic Panel: Recent Labs  Lab 07/21/20 1957  NA 138  K 4.0  CL 99  CO2 26  GLUCOSE 95  BUN 9  CREATININE 0.78  CALCIUM 8.5*   GFR: Estimated Creatinine Clearance: 108.5 mL/min (by C-G formula based on SCr of 0.78 mg/dL). Liver Function Tests:  Recent Labs  Lab 07/21/20 1957  AST 29  ALT 22  ALKPHOS 55  BILITOT 0.7  PROT 7.7  ALBUMIN 3.9   No results for input(s): LIPASE, AMYLASE in the last 168 hours. No results for input(s): AMMONIA in the last 168 hours. Coagulation Profile: No results for input(s): INR, PROTIME in the last 168 hours. Cardiac Enzymes: No results for input(s): CKTOTAL, CKMB, CKMBINDEX, TROPONINI in the last 168 hours. BNP (last 3 results) No results for input(s): PROBNP in the last 8760 hours. HbA1C: No results for input(s): HGBA1C in the last 72 hours. CBG: No results for  input(s): GLUCAP in the last 168 hours. Lipid Profile: Recent Labs    07/21/20 1957  TRIG 102   Thyroid Function Tests: No results for input(s): TSH, T4TOTAL, FREET4, T3FREE, THYROIDAB in the last 72 hours. Anemia Panel: Recent Labs    07/21/20 1957  FERRITIN 558*   Urine analysis:    Component Value Date/Time   COLORURINE YELLOW 07/14/2017 [place] 07/14/2017 1528   LABSPEC 1.014 07/14/2017 1528   PHURINE 6.0 07/14/2017 1528   GLUCOSEU NEGATIVE 07/14/2017 1528   HGBUR NEGATIVE 07/14/2017 [place] 07/14/2017 1528   KETONESUR NEGATIVE 07/14/2017 1528   PROTEINUR NEGATIVE 07/14/2017 1528   UROBILINOGEN 0.2 05/03/2014 1223   NITRITE NEGATIVE 07/14/2017 1528   LEUKOCYTESUR NEGATIVE 07/14/2017 1528   Sepsis Labs: @LABRCNTIP (procalcitonin:4,lacticidven:4) ) Recent Results (from the past 240 hour(s))  SARS CORONAVIRUS 2 (TAT 6-24 HRS) Nasopharyngeal Nasopharyngeal Swab     Status: Abnormal   Collection Time: 07/14/20 11:25 AM   Specimen: Nasopharyngeal Swab  Result Value Ref Range Status   SARS Coronavirus 2 POSITIVE (A) NEGATIVE Final    Comment: Emailed to [employer] @ 1920 on 07/14/2020 by L Lamont (NOTE) SARS-CoV-2 target nucleic acids are DETECTED.  The SARS-CoV-2 RNA is generally detectable in upper and lower respiratory specimens during the acute phase of infection. Positive results are indicative of the presence of SARS-CoV-2 RNA. Clinical correlation with patient history and other diagnostic information is  necessary to determine patient infection status. Positive results do not rule out bacterial infection or co-infection with other viruses.  The expected result is Negative.  Fact Sheet for Patients: SugarRoll.be  Fact Sheet for Healthcare Providers: https://www.woods-mathews.com/  This test is not yet approved or cleared by the [place] FDA and  has been authorized for  detection and/or diagnosis of SARS-CoV-2 by FDA under an Emergency Use Authorization (EUA). This EUA will remain  in effect (meaning this test can be used) for the duration of the C OVID-19 declaration under Section 564(b)(1) of the Act, 21 U.S.C. section 360bbb-3(b)(1), unless the authorization is terminated or revoked sooner.   Performed at [facility], [place] [address] [place], [place] [address]      Radiological Exams on Admission: DG Chest Portable 1 View  Result Date: 07/21/2020 CLINICAL DATA:  Coronavirus infection.  Shortness of breath. EXAM: PORTABLE CHEST 1 VIEW COMPARISON:  11/19/2018 FINDINGS: Heart size is normal. Hazy infiltrates in the mid and lower lungs consistent with viral pneumonia. No dense consolidation, collapse or effusion. No acute bone finding. IMPRESSION: Hazy infiltrates in the mid and lower lungs consistent with viral pneumonia. Electronically Signed   By: Nelson Chimes M.D.   On: 07/21/2020 18:06    EKG: Independently reviewed.  Normal sinus rhythm.  Assessment/Plan Principal Problem:   Acute respiratory failure due to COVID-19 [facility]) Active Problems:   Acute respiratory disease  due to COVID-19 virus    1. Acute respiratory failure with hypoxia secondary to COVID-19 infection for which I have started patient on IV remdesivir and Solu-Medrol.  Presently on 3 L oxygen.  Closely follow respiratory status and inflammatory markers.  Patient has consented for Actemra/Bircatinib if required after explaining the off label use of these medication and also the side effects and contraindications.  Since patient is acute respiratory failure with hypoxia with Covid infection will need inpatient status.   DVT prophylaxis: Lovenox. Code Status: Full code. Family Communication: Discussed with patient. Disposition Plan: Home. Consults called: None. Admission status: Inpatient.   Rise Patience MD [facility] Pager [phone].  If 7PM-7AM,  please contact night-coverage [email or web address] Password [facility]  07/21/2020, 10:48 PM

## 2020-07-21 NOTE — ED Provider Notes (Signed)
[facility] DEPT Provider Note   CSN: [id number] Arrival date & time: 07/21/20  1642     History Chief Complaint  Patient presents with  . covid positive  . Shortness of Breath    Terri Gibson is a 44 y.o. female.  The history is provided by the patient and medical records. No language interpreter was used.  Shortness of Breath    44 year old obese female with history of gestational diabetes previously diagnosed with COVID-19 presenting complaining of shortness of breath.  For the past 10 days patient has had symptoms including fever, chills, body aches, loss of taste and smell, nausea vomiting diarrhea and generalized fatigue.  She endorsed increased shortness of breath for the past few days.  Today even with short walk she became very short of breath and feel as if she is going to pass out.  She denies any specific treatment tried at home.  She is unvaccinated.  She denies any significant chest pain, abdominal pain or dysuria.  Past Medical History:  Diagnosis Date  . Fibroids   . Gestational diabetes   . Yeast infection     There are no problems to display for this patient.   Past Surgical History:  Procedure Laterality Date  . ABDOMINAL HYSTERECTOMY       OB History   No obstetric history on file.     Family History  Problem Relation Age of Onset  . Diabetes Other   . Hyperlipidemia Other   . Hypertension Other     Social History   Tobacco Use  . Smoking status: Never Smoker  . Smokeless tobacco: Never Used  Substance Use Topics  . Alcohol use: No  . Drug use: No    Home Medications Prior to Admission medications   Medication Sig Start Date End Date Taking? Authorizing Provider  naproxen sodium (ALEVE) 220 MG tablet Take 440 mg by mouth daily as needed (pain).    [provider]  ondansetron (ZOFRAN-ODT) 4 MG disintegrating tablet Take 1 tablet (4 mg total) by mouth every 8 (eight) hours as needed for nausea  or vomiting. 07/14/20   Vanessa Kick, MD    Allergies    Hydrocodone-ibuprofen, Rabbit protein, and Tomato  Review of Systems   Review of Systems  Respiratory: Positive for shortness of breath.   All other systems reviewed and are negative.   Physical Exam Updated Vital Signs BP 127/81 (BP Location: Left Arm)   Pulse (!) 102   Temp 98.4 F (36.9 C) (Oral)   Resp (!) 33   SpO2 92%   Physical Exam Vitals and nursing note reviewed.  Constitutional:      General: She is not in acute distress.    Appearance: She is well-developed. She is obese.  HENT:     Head: Atraumatic.  Eyes:     Conjunctiva/sclera: Conjunctivae normal.  Cardiovascular:     Rate and Rhythm: Tachycardia present.  Pulmonary:     Effort: Tachypnea present.     Breath sounds: Rhonchi present. No decreased breath sounds, wheezing or rales.  Chest:     Chest wall: No tenderness.  Abdominal:     Palpations: Abdomen is soft.     Tenderness: There is no abdominal tenderness.  Musculoskeletal:     Cervical back: Neck supple.     Right lower leg: No edema.     Left lower leg: No edema.  Skin:    Findings: No rash.  Neurological:  Mental Status: She is alert and oriented to person, place, and time.  Psychiatric:        Mood and Affect: Mood normal.     ED Results / Procedures / Treatments   Labs (all labs ordered are listed, but only abnormal results are displayed) Labs Reviewed  COMPREHENSIVE METABOLIC PANEL - Abnormal; Notable for the following components:      Result Value   Calcium 8.5 (*)    All other components within normal limits  D-DIMER, QUANTITATIVE (NOT AT [facility]) - Abnormal; Notable for the following components:   D-Dimer, Quant 0.98 (*)    All other components within normal limits  LACTATE DEHYDROGENASE - Abnormal; Notable for the following components:   LDH 321 (*)    All other components within normal limits  FERRITIN - Abnormal; Notable for the following components:   Ferritin  558 (*)    All other components within normal limits  FIBRINOGEN - Abnormal; Notable for the following components:   Fibrinogen 694 (*)    All other components within normal limits  C-REACTIVE PROTEIN - Abnormal; Notable for the following components:   CRP 14.2 (*)    All other components within normal limits  CULTURE, BLOOD (ROUTINE X 2)  CULTURE, BLOOD (ROUTINE X 2)  LACTIC ACID, PLASMA  CBC WITH DIFFERENTIAL/PLATELET  PROCALCITONIN  TRIGLYCERIDES  HCG, QUANTITATIVE, PREGNANCY  LACTIC ACID, PLASMA    EKG None  ED ECG REPORT   Date: 07/21/2020  Rate: 100  Rhythm: sinus tachycardia  QRS Axis: left  Intervals: normal  ST/T Wave abnormalities: nonspecific ST changes  Conduction Disutrbances:none  Narrative Interpretation:   Old EKG Reviewed: unchanged  I have personally reviewed the EKG tracing and agree with the computerized printout as noted.   Radiology DG Chest Portable 1 View  Result Date: 07/21/2020 CLINICAL DATA:  Coronavirus infection.  Shortness of breath. EXAM: PORTABLE CHEST 1 VIEW COMPARISON:  11/19/2018 FINDINGS: Heart size is normal. Hazy infiltrates in the mid and lower lungs consistent with viral pneumonia. No dense consolidation, collapse or effusion. No acute bone finding. IMPRESSION: Hazy infiltrates in the mid and lower lungs consistent with viral pneumonia. Electronically Signed   By: Nelson Chimes M.D.   On: 07/21/2020 18:06    Procedures Procedures (including critical care time)  Medications Ordered in ED Medications  methylPREDNISolone sodium succinate (SOLU-MEDROL) 125 mg/2 mL injection 51.875 mg (has no administration in time range)  morphine 4 MG/ML injection 4 mg (4 mg Intravenous Given 07/21/20 2123)    ED Course  I have reviewed the triage vital signs and the nursing notes.  Pertinent labs & imaging results that were available during my care of the patient were reviewed by me and considered in my medical decision making (see chart for  details).    MDM Rules/Calculators/A&P                          BP 112/81   Pulse 89   Temp 98.4 F (36.9 C) (Oral)   Resp (!) 30   SpO2 100%   Final Clinical Impression(s) / ED Diagnoses Final diagnoses:  Acute hypoxemic respiratory failure due to COVID-19 [facility])    Rx / DC Orders ED Discharge Orders    None     7:10 PM Patient with Covid symptoms for the past 10 days, tested positive for Covid 19 on 08/18 who is here with significant shortness of breath.  Patient was initially seen at urgent  care center and at that time her O2 sats was 88% on room air, EMS arrived and transfer patient to the ER and placed her on supplemental oxygen with improvement of her symptoms.  Given her worsening shortness of breath and new hypoxia, will admit for further managements of her Covid symptoms and new hypoxia.  She would likely benefit from steroid and remdesivir.  9:42 PM Chest x-ray shows hazy infiltrates in the mid and lower lungs consistent with viral pneumonia.  Mild elevations of patient's laboratory markers.  Due to new hypoxia and progressive worsening of her symptoms after 10 days, will consult for admission.  9:50 PM Appreciate consultation from [facility] Dr. Hal Hope who agrees to admit pt.  He request pt to be started on Remdesevir and Solumedrol.  WIll order.   Terri Gibson was evaluated in Emergency Department on 07/21/2020 for the symptoms described in the history of present illness. She was evaluated in the context of the global COVID-19 pandemic, which necessitated consideration that the patient might be at risk for infection with the SARS-CoV-2 virus that causes COVID-19. Institutional protocols and algorithms that pertain to the evaluation of patients at risk for COVID-19 are in a state of rapid change based on information released by regulatory bodies including the CDC and federal and state organizations. These policies and algorithms were followed during the  patient's care in the ED.    Domenic Moras, PA-C 07/21/20 2151    Daleen Bo, MD 07/22/20 1739

## 2020-07-21 NOTE — ED Notes (Addendum)
Pt states that she feels more comfortable with the oxygen on. This Rn placed pt on 2L O2 via nasal canula. O2 on room air b/w94-95%. On 2L 98%

## 2020-07-22 LAB — CBC WITH DIFFERENTIAL/PLATELET
Abs Immature Granulocytes: 0.02 10*3/uL (ref 0.00–0.07)
Basophils Absolute: 0 10*3/uL (ref 0.0–0.1)
Basophils Relative: 0 %
Eosinophils Absolute: 0 10*3/uL (ref 0.0–0.5)
Eosinophils Relative: 0 %
HCT: 37.7 % (ref 36.0–46.0)
Hemoglobin: 12.1 g/dL (ref 12.0–15.0)
Immature Granulocytes: 1 %
Lymphocytes Relative: 21 %
Lymphs Abs: 0.7 10*3/uL (ref 0.7–4.0)
MCH: 27.1 pg (ref 26.0–34.0)
MCHC: 32.1 g/dL (ref 30.0–36.0)
MCV: 84.5 fL (ref 80.0–100.0)
Monocytes Absolute: 0.1 10*3/uL (ref 0.1–1.0)
Monocytes Relative: 2 %
Neutro Abs: 2.7 10*3/uL (ref 1.7–7.7)
Neutrophils Relative %: 76 %
Platelets: 248 10*3/uL (ref 150–400)
RBC: 4.46 MIL/uL (ref 3.87–5.11)
RDW: 14.2 % (ref 11.5–15.5)
WBC: 3.5 10*3/uL — ABNORMAL LOW (ref 4.0–10.5)
nRBC: 0 % (ref 0.0–0.2)

## 2020-07-22 LAB — BLOOD CULTURE ID PANEL (REFLEXED) - BCID2

## 2020-07-22 LAB — D-DIMER, QUANTITATIVE: D-Dimer, Quant: 1.03 ug/mL-FEU — ABNORMAL HIGH (ref 0.00–0.50)

## 2020-07-22 LAB — COMPREHENSIVE METABOLIC PANEL
ALT: 27 U/L (ref 0–44)
AST: 38 U/L (ref 15–41)
Albumin: 3.4 g/dL — ABNORMAL LOW (ref 3.5–5.0)
Alkaline Phosphatase: 53 U/L (ref 38–126)
Anion gap: 10 (ref 5–15)
BUN: 10 mg/dL (ref 6–20)
CO2: 24 mmol/L (ref 22–32)
Calcium: 8.4 mg/dL — ABNORMAL LOW (ref 8.9–10.3)
Chloride: 102 mmol/L (ref 98–111)
Creatinine, Ser: 0.68 mg/dL (ref 0.44–1.00)
GFR calc Af Amer: >60 mL/min (ref >60)
GFR calc non Af Amer: >60 mL/min (ref >60)
Glucose, Bld: 156 mg/dL — ABNORMAL HIGH (ref 70–99)
Potassium: 3.9 mmol/L (ref 3.5–5.1)
Sodium: 136 mmol/L (ref 135–145)
Total Bilirubin: 0.4 mg/dL (ref 0.3–1.2)
Total Protein: 7.4 g/dL (ref 6.5–8.1)

## 2020-07-22 LAB — HIV ANTIBODY (ROUTINE TESTING W REFLEX): HIV Screen 4th Generation wRfx: NONREACTIVE

## 2020-07-22 LAB — C-REACTIVE PROTEIN: CRP: 13.4 mg/dL — ABNORMAL HIGH (ref <1.0)

## 2020-07-22 LAB — FERRITIN: Ferritin: 510 ng/mL — ABNORMAL HIGH (ref 11–307)

## 2020-07-22 MED ORDER — METHYLPREDNISOLONE SODIUM SUCC 40 MG IJ SOLR
40.0000 mg | Freq: Two times a day (BID) | INTRAMUSCULAR | Status: DC
  Administered 2020-07-22 – 2020-07-25 (×6): 40 mg via INTRAVENOUS
  Filled 2020-07-22 (×6): qty 1

## 2020-07-22 MED ORDER — VANCOMYCIN HCL 2000 MG/400ML IV SOLN
2000.0000 mg | Freq: Once | INTRAVENOUS | Status: AC
  Administered 2020-07-23: 2000 mg via INTRAVENOUS
  Filled 2020-07-22: qty 400

## 2020-07-22 MED ORDER — ACETAMINOPHEN 325 MG PO TABS
650.0000 mg | ORAL_TABLET | Freq: Four times a day (QID) | ORAL | Status: DC | PRN
  Administered 2020-07-22 – 2020-07-25 (×5): 650 mg via ORAL
  Filled 2020-07-22 (×5): qty 2

## 2020-07-22 NOTE — ED Notes (Signed)
Assumed care of pt at this time. Pt resting in stretcher, cont on 2L [place] with good effect, no s/sx of acute distress at this time.

## 2020-07-22 NOTE — Progress Notes (Signed)
PROGRESS NOTE  Terri Gibson  DOB: 08-21-1976  PCP: Patient, [name] [id number]  DOA: 07/21/2020  LOS: 1 day   Chief Complaint  Patient presents with  . covid positive  . Shortness of Breath   Brief narrative: Terri Gibson is a 44 y.o. female with no significant past medical history who presented to the ED on 8/25 with complaint of shortness of breath.   8/18, patient was diagnosed with COVID-19 infection.  She had headache and upper respiratory symptoms at that time.  Her symptoms progressively worsened to dyspnea and cough even at rest.  In the ED, patient was afebrile, heart rate 102, respiratory rate high 20s to low 30s, oxygen saturation more than 90% on 3 L by nasal cannula. WBC normal, CRP elevated to 14.2, lactic acid, procalcitonin normal. Chest x-ray showed hazy infiltrates in the mid and lower lungs consistent with viral pneumonia. Patient was hospitalized for COVID-19 pneumonia. She has not been vaccinated against Covid.  Subjective: Patient was seen and examined this morning. She was in the ED waiting for bed availability.  On 2 L oxygen by nasal collar. Continues to feel bad.  She states she was working extra hours and did not get time to get vaccinated. Chart reviewed.   No fever.  Heart rate in 70s and 80s, respiratory rate is still in high 20s, blood pressure mostly in 140s Repeat labs this morning show CRP only slightly improved to 13.4.  Assessment/Plan: COVID pneumonia Acute respiratory failure with hypoxia  -Presented with worsening shortness of breath -COVID test: PCR positive as an outpatient on 8/18 -Chest imaging: Chest x-ray on admission with hazy infiltrates in the mid and lower lungs  -Treatment: Patient has been started on a 5-day course of IV remdesivir to complete on 8/29.  Also on Solu-Medrol 40 mg twice daily with a plan to taper.  Does not require Actemra or baricitinib at this time. -Protonix while on steroids. -Supportive care:  Vitamin C, Zinc, PRN inhalers, Tylenol, Antitussives (benzonatate/ Mucinex/Tussionex).  -Encouraged incentive spirometry, out of bed and early mobilization as much as possible  -Progression: Has not felt improvement yet. -Oxygen - SpO2: 95 % O2 Flow Rate (L/min): 2 L/min -Continue airborne/contact isolation precautions for duration of 3 weeks from the day of diagnosis. -WBC and inflammatory markers trend as below.  Lab Results  Component Value Date   SARSCOV2NAA POSITIVE (A) 07/14/2020   [place] Not Detected 12/08/2019   [place] Not Detected 10/28/2019   [place] Not Detected 08/14/2019    Recent Labs  Lab 07/21/20 1957 07/22/20 0431  WBC 6.3 3.5*  LATICACIDVEN 1.3  --   PROCALCITON <0.10  --   DDIMER 0.98* 1.03*  FERRITIN 558*  --   LDH 321*  --   CRP 14.2* 13.4*  ALT 22 27   The treatment plan and use of medications and known side effects were discussed with patient/family. Some of the medications used are based on case reports/anecdotal data.  All other medications being used in the management of COVID-19 based on limited study data.  Complete risks and long-term side effects are unknown, however in the best clinical judgment they seem to be of some benefit.  Patient wanted to proceed with treatment options provided.  Mobility: Encourage early mobilization Code Status:   Code Status: Full Code  Nutritional status: There is no height or weight on file to calculate BMI.     Diet Order  Diet regular Room service appropriate? Yes; Fluid consistency: Thin  Diet effective now                 DVT prophylaxis: enoxaparin (LOVENOX) injection 40 mg Start: 07/21/20 2315   Antimicrobials:  IV remdesivir Fluid: None  Consultants: None Family Communication:  None at bedside  Status is: Inpatient  Remains inpatient appropriate because:Ongoing diagnostic testing needed not appropriate for outpatient work up and IV treatments appropriate due to  intensity of illness or inability to take PO   Dispo: The patient is from: Home              Anticipated d/c is to: Home              Anticipated d/c date is: 3 days              Patient currently is not medically stable to d/c.       Infusions:  . remdesivir 100 mg in NS 100 mL Stopped (07/22/20 0944)    Scheduled Meds: . vitamin C  500 mg Oral Daily  . enoxaparin (LOVENOX) injection  40 mg Subcutaneous QHS  . methylPREDNISolone (SOLU-MEDROL) injection  40 mg Intravenous Q12H  . zinc sulfate  220 mg Oral Daily    Antimicrobials: Anti-infectives (From admission, onward)   Start     Dose/Rate Route Frequency Ordered Stop   07/22/20 1000  remdesivir 100 mg in sodium chloride 0.9 % 100 mL IVPB        100 mg 200 mL/hr over 30 Minutes Intravenous Daily 07/21/20 2154 07/26/20 0959   07/21/20 2200  remdesivir 200 mg in sodium chloride 0.9% 250 mL IVPB        200 mg 580 mL/hr over 30 Minutes Intravenous Once 07/21/20 2154 07/21/20 2250      PRN meds: guaiFENesin-dextromethorphan, ondansetron **OR** ondansetron (ZOFRAN) IV   Objective: Vitals:   07/22/20 1400 07/22/20 1637  BP: 128/79 128/79  Pulse: 89 87  Resp: (!) 32 20  Temp:  98.1 F (36.7 C)  SpO2: 97% 95%    Intake/Output Summary (Last 24 hours) at 07/22/2020 1643 Last data filed at 07/22/2020 0944 Gross per 24 hour  Intake 390.11 ml  Output --  Net 390.11 ml   There were no vitals filed for this visit. Weight change:  There is no height or weight on file to calculate BMI.   Physical Exam: General exam: Appears calm and comfortable.  Not in physical distress but feels weak Skin: No rashes, lesions or ulcers. HEENT: Atraumatic, normocephalic, supple neck, no obvious bleeding Lungs: Clear to auscultate bilaterally CVS: Regular rate and rhythm, no murmur GI/Abd soft, nontender, nondistended, bowel sound present CNS: Alert, awake, oriented x3.  Generalized weakness present Psychiatry: Mood  appropriate Extremities: No pedal edema, no calf tenderness  Data Review: I have personally reviewed the laboratory data and studies available.  Recent Labs  Lab 07/21/20 1957 07/22/20 0431  WBC 6.3 3.5*  NEUTROABS 4.1 2.7  HGB 13.0 12.1  HCT 41.0 37.7  MCV 85.4 84.5  PLT 252 248   Recent Labs  Lab 07/21/20 1957 07/22/20 0431  NA 138 136  K 4.0 3.9  CL 99 102  CO2 26 24  GLUCOSE 95 156*  BUN 9 10  CREATININE 0.78 0.68  CALCIUM 8.5* 8.4*   No results found for: HGBA1C     Component Value Date/Time   TRIG 102 07/21/2020 1957   Signed, Terrilee Croak, MD [facility] Pager: [phone] (Secure  Chat preferred). 07/22/2020

## 2020-07-22 NOTE — ED Notes (Signed)
Pt given breakfast tray, medicated as ordered.

## 2020-07-22 NOTE — ED Notes (Signed)
Report to the floor

## 2020-07-23 LAB — CBC WITH DIFFERENTIAL/PLATELET
Abs Immature Granulocytes: 0.09 10*3/uL — ABNORMAL HIGH (ref 0.00–0.07)
Basophils Absolute: 0.1 10*3/uL (ref 0.0–0.1)
Basophils Relative: 0 %
Eosinophils Absolute: 0.1 10*3/uL (ref 0.0–0.5)
Eosinophils Relative: 0 %
HCT: 43.2 % (ref 36.0–46.0)
Hemoglobin: 13.2 g/dL (ref 12.0–15.0)
Immature Granulocytes: 1 %
Lymphocytes Relative: 11 %
Lymphs Abs: 1.4 10*3/uL (ref 0.7–4.0)
MCH: 26.9 pg (ref 26.0–34.0)
MCHC: 30.6 g/dL (ref 30.0–36.0)
MCV: 88.2 fL (ref 80.0–100.0)
Monocytes Absolute: 0.6 10*3/uL (ref 0.1–1.0)
Monocytes Relative: 4 %
Neutro Abs: 10.7 10*3/uL — ABNORMAL HIGH (ref 1.7–7.7)
Neutrophils Relative %: 84 %
Platelets: 328 10*3/uL (ref 150–400)
RBC: 4.9 MIL/uL (ref 3.87–5.11)
RDW: 14.3 % (ref 11.5–15.5)
WBC: 12.8 10*3/uL — ABNORMAL HIGH (ref 4.0–10.5)
nRBC: 0 % (ref 0.0–0.2)

## 2020-07-23 LAB — D-DIMER, QUANTITATIVE: D-Dimer, Quant: 0.79 ug/mL-FEU — ABNORMAL HIGH (ref 0.00–0.50)

## 2020-07-23 LAB — COMPREHENSIVE METABOLIC PANEL
ALT: 30 U/L (ref 0–44)
AST: 30 U/L (ref 15–41)
Albumin: 3.6 g/dL (ref 3.5–5.0)
Alkaline Phosphatase: 59 U/L (ref 38–126)
Anion gap: 13 (ref 5–15)
BUN: 13 mg/dL (ref 6–20)
CO2: 21 mmol/L — ABNORMAL LOW (ref 22–32)
Calcium: 8.8 mg/dL — ABNORMAL LOW (ref 8.9–10.3)
Chloride: 106 mmol/L (ref 98–111)
Creatinine, Ser: 0.68 mg/dL (ref 0.44–1.00)
GFR calc Af Amer: >60 mL/min (ref >60)
GFR calc non Af Amer: >60 mL/min (ref >60)
Glucose, Bld: 134 mg/dL — ABNORMAL HIGH (ref 70–99)
Potassium: 4.2 mmol/L (ref 3.5–5.1)
Sodium: 140 mmol/L (ref 135–145)
Total Bilirubin: 0.4 mg/dL (ref 0.3–1.2)
Total Protein: 7.7 g/dL (ref 6.5–8.1)

## 2020-07-23 LAB — C-REACTIVE PROTEIN: CRP: 7.4 mg/dL — ABNORMAL HIGH (ref <1.0)

## 2020-07-23 LAB — FERRITIN: Ferritin: 548 ng/mL — ABNORMAL HIGH (ref 11–307)

## 2020-07-23 MED ORDER — MELATONIN 3 MG PO TABS
3.0000 mg | ORAL_TABLET | Freq: Every day | ORAL | Status: DC
  Administered 2020-07-23 – 2020-07-24 (×2): 3 mg via ORAL
  Filled 2020-07-23 (×2): qty 1

## 2020-07-23 MED ORDER — ENOXAPARIN SODIUM 60 MG/0.6ML ~~LOC~~ SOLN
50.0000 mg | Freq: Every day | SUBCUTANEOUS | Status: DC
  Administered 2020-07-23 – 2020-07-24 (×2): 50 mg via SUBCUTANEOUS
  Filled 2020-07-23 (×3): qty 0.6

## 2020-07-23 MED ORDER — ALPRAZOLAM 0.5 MG PO TABS
0.5000 mg | ORAL_TABLET | Freq: Three times a day (TID) | ORAL | Status: DC | PRN
  Administered 2020-07-23 – 2020-07-24 (×2): 0.5 mg via ORAL
  Filled 2020-07-23 (×3): qty 1

## 2020-07-23 MED ORDER — BENZONATATE 100 MG PO CAPS
200.0000 mg | ORAL_CAPSULE | Freq: Three times a day (TID) | ORAL | Status: DC
  Administered 2020-07-23 – 2020-07-25 (×6): 200 mg via ORAL
  Filled 2020-07-23 (×6): qty 2

## 2020-07-23 MED ORDER — VANCOMYCIN HCL 1250 MG/250ML IV SOLN
1250.0000 mg | Freq: Two times a day (BID) | INTRAVENOUS | Status: DC
  Administered 2020-07-23 – 2020-07-25 (×5): 1250 mg via INTRAVENOUS
  Filled 2020-07-23 (×5): qty 250

## 2020-07-23 NOTE — Progress Notes (Signed)
PHARMACY - PHYSICIAN COMMUNICATION CRITICAL VALUE ALERT - BLOOD CULTURE IDENTIFICATION (BCID)  Terri Gibson is an 44 y.o. female who presented to [facility] on 07/21/2020 with a chief complaint of SOB due to COVID-19   Assessment:  COVID pneumonia Acute respiratory failure with hypoxia   Name of physician (or Provider) Contacted: Dr. Hal Hope   Current antibiotics: remdesivir   Changes to prescribed antibiotics recommended:  Add vancomycin   - Vancomycin 2000 mg IV x1, then 1250 mg IV q12h    Results for orders placed or performed during the hospital encounter of 07/21/20  Blood Culture ID Panel (Reflexed) (Collected: 07/21/2020  7:57 PM)  Result Value Ref Range   Enterococcus faecalis NOT DETECTED NOT DETECTED   Enterococcus Faecium NOT DETECTED NOT DETECTED   Listeria monocytogenes NOT DETECTED NOT DETECTED   Staphylococcus species DETECTED (A) NOT DETECTED   Staphylococcus aureus (BCID) NOT DETECTED NOT DETECTED   Staphylococcus epidermidis DETECTED (A) NOT DETECTED   Staphylococcus lugdunensis NOT DETECTED NOT DETECTED   Streptococcus species NOT DETECTED NOT DETECTED   Streptococcus agalactiae NOT DETECTED NOT DETECTED   Streptococcus pneumoniae NOT DETECTED NOT DETECTED   Streptococcus pyogenes NOT DETECTED NOT DETECTED   A.calcoaceticus-baumannii NOT DETECTED NOT DETECTED   Bacteroides fragilis NOT DETECTED NOT DETECTED   Enterobacterales NOT DETECTED NOT DETECTED   Enterobacter cloacae complex NOT DETECTED NOT DETECTED   Escherichia coli NOT DETECTED NOT DETECTED   Klebsiella aerogenes NOT DETECTED NOT DETECTED   Klebsiella oxytoca NOT DETECTED NOT DETECTED   Klebsiella pneumoniae NOT DETECTED NOT DETECTED   Proteus species NOT DETECTED NOT DETECTED   Salmonella species NOT DETECTED NOT DETECTED   Serratia marcescens NOT DETECTED NOT DETECTED   Haemophilus influenzae NOT DETECTED NOT DETECTED   Neisseria meningitidis NOT DETECTED NOT DETECTED   Pseudomonas  aeruginosa NOT DETECTED NOT DETECTED   Stenotrophomonas maltophilia NOT DETECTED NOT DETECTED   Candida albicans NOT DETECTED NOT DETECTED   Candida auris NOT DETECTED NOT DETECTED   Candida glabrata NOT DETECTED NOT DETECTED   Candida krusei NOT DETECTED NOT DETECTED   Candida parapsilosis NOT DETECTED NOT DETECTED   Candida tropicalis NOT DETECTED NOT DETECTED   Cryptococcus neoformans/gattii NOT DETECTED NOT DETECTED   Methicillin resistance mecA/C NOT DETECTED NOT DETECTED    Royetta Asal, PharmD, BCPS 07/23/2020 12:50 AM

## 2020-07-23 NOTE — Progress Notes (Addendum)
PROGRESS NOTE  BARBARAJEAN KINZLER  DOB: Apr 06, 1976  PCP: Patient, [name] [id number]  DOA: 07/21/2020  LOS: 2 days   Chief Complaint  Patient presents with  . covid positive  . Shortness of Breath   Brief narrative: Terri Gibson is a 44 y.o. female with no significant past medical history who presented to the ED on 8/25 with complaint of shortness of breath.   8/18, patient was diagnosed with COVID-19 infection.  She had headache and upper respiratory symptoms at that time.  Her symptoms progressively worsened to dyspnea and cough even at rest.  In the ED, patient was afebrile, heart rate 102, respiratory rate high 20s to low 30s, oxygen saturation more than 90% on 3 L by nasal cannula. WBC normal, CRP elevated to 14.2, lactic acid, procalcitonin normal. Chest x-ray showed hazy infiltrates in the mid and lower lungs consistent with viral pneumonia. Patient was hospitalized for COVID-19 pneumonia. She has not been vaccinated against Covid.  Subjective: Patient was seen and examined this morning. Remains on 3 L oxygen by nasal cannula. Complains of cough as a major problem today Feels tired because she could not sleep at all last night. She has history of insomnia, takes sleeping pills at home, could not get it last night. Also she is on high-dose steroids now probably contributing to insomnia. Labs from this morning with CRP level improving, ferritin level still remains elevated. Blood culture is growing staph epidermidis, true versus contamination. Started on vancomycin last night.  Assessment/Plan: COVID pneumonia Acute respiratory failure with hypoxia  -Presented with worsening shortness of breath -COVID test: PCR positive as an outpatient on 8/18 -Chest imaging: Chest x-ray on admission with hazy infiltrates in the mid and lower lungs  -Treatment: Patient has been started on a 5-day course of IV remdesivir to complete on 8/29.  Also on Solu-Medrol 40 mg twice daily.  Continue the same for now. Does not require Actemra or baricitinib at this time. -Protonix while on steroids. -Supportive care: Vitamin C, Zinc, PRN inhalers, Tylenol, antitussives. We will make benzonatate scheduled. -Encouraged incentive spirometry, out of bed and early mobilization as much as possible  -Progression: Has not felt improvement yet. -Oxygen - SpO2: 97 % O2 Flow Rate (L/min): 2 L/min -Continue airborne/contact isolation precautions for duration of 3 weeks from the day of diagnosis. -WBC and inflammatory markers trend as below. Improving CRP, D-dimer.  Lab Results  Component Value Date   SARSCOV2NAA POSITIVE (A) 07/14/2020   [place] Not Detected 12/08/2019   [place] Not Detected 10/28/2019   [place] Not Detected 08/14/2019    Recent Labs  Lab 07/21/20 1957 07/22/20 0431 07/22/20 0438 07/23/20 0349  WBC 6.3 3.5*  --  12.8*  LATICACIDVEN 1.3  --   --   --   PROCALCITON <0.10  --   --   --   DDIMER 0.98* 1.03*  --  0.79*  FERRITIN 558*  --  510* 548*  LDH 321*  --   --   --   CRP 14.2* 13.4*  --  7.4*  ALT 22 27  --  30   The treatment plan and use of medications and known side effects were discussed with patient/family. Some of the medications used are based on case reports/anecdotal data.  All other medications being used in the management of COVID-19 based on limited study data.  Complete risks and long-term side effects are unknown, however in the best clinical judgment they seem to be of some benefit.  Patient wanted to proceed with treatment options provided.  Gram-positive bacteremia -Pulmonary blood culture showing staph epidermidis. True infection versus contamination.  -Started on vancomycin last night. Continue the same. -Repeat blood culture ordered today.  Anxiety/insomnia -Feels tired because she could not sleep at all last night. She has history of insomnia, takes sleeping pills at home, could not get it last night. Also she is on  high-dose steroids now probably contributing to insomnia and anxiety. -We will start on Xanax 0.5 mg 3 times daily as needed as well as melatonin 3 mg at bedtime as needed.  Mobility: Encourage early mobilization Code Status:   Code Status: Full Code  Nutritional status: Body mass index is 38.52 kg/m.     Diet Order            Diet regular Room service appropriate? Yes; Fluid consistency: Thin  Diet effective now                 DVT prophylaxis: Lovenox subcu   Antimicrobials:  IV remdesivir  Fluid: None  Consultants: None Family Communication:  None at bedside  Status is: Inpatient  Remains inpatient appropriate because:Ongoing diagnostic testing needed not appropriate for outpatient work up and IV treatments appropriate due to intensity of illness or inability to take PO   Dispo: The patient is from: Home              Anticipated d/c is to: Home              Anticipated d/c date is: 3 days              Patient currently is not medically stable to d/c.       Infusions:  . remdesivir 100 mg in NS 100 mL 100 mg (07/23/20 1138)  . vancomycin      Scheduled Meds: . vitamin C  500 mg Oral Daily  . benzonatate  200 mg Oral TID  . enoxaparin (LOVENOX) injection  50 mg Subcutaneous QHS  . melatonin  3 mg Oral QHS  . methylPREDNISolone (SOLU-MEDROL) injection  40 mg Intravenous Q12H  . zinc sulfate  220 mg Oral Daily    Antimicrobials: Anti-infectives (From admission, onward)   Start     Dose/Rate Route Frequency Ordered Stop   07/23/20 1200  vancomycin (VANCOREADY) IVPB 1250 mg/250 mL        1,250 mg 166.7 mL/hr over 90 Minutes Intravenous Every 12 hours 07/23/20 0052     07/22/20 2345  vancomycin (VANCOREADY) IVPB 2000 mg/400 mL        2,000 mg 200 mL/hr over 120 Minutes Intravenous  Once 07/22/20 2334 07/23/20 0754   07/22/20 1000  remdesivir 100 mg in sodium chloride 0.9 % 100 mL IVPB        100 mg 200 mL/hr over 30 Minutes Intravenous Daily 07/21/20 2154  07/26/20 0959   07/21/20 2200  remdesivir 200 mg in sodium chloride 0.9% 250 mL IVPB        200 mg 580 mL/hr over 30 Minutes Intravenous Once 07/21/20 2154 07/21/20 2250      PRN meds: acetaminophen, ALPRAZolam, guaiFENesin-dextromethorphan, ondansetron **OR** ondansetron (ZOFRAN) IV   Objective: Vitals:   07/23/20 0005 07/23/20 0420  BP: 127/85 121/88  Pulse: 81 71  Resp: 20 (!) 21  Temp: 98.3 F (36.8 C) 98 F (36.7 C)  SpO2: 98% 97%    Intake/Output Summary (Last 24 hours) at 07/23/2020 1300 Last data filed at 07/23/2020 0459 Gross per 24  hour  Intake 295.19 ml  Output 1 ml  Net 294.19 ml   Filed Weights   07/22/20 2024  Weight: 105 kg   Weight change:  Body mass index is 38.52 kg/m.   Physical Exam: General exam: Appears calm and comfortable. Feels tired. Not in physical distress Skin: No rashes, lesions or ulcers. HEENT: Atraumatic, normocephalic, supple neck, no obvious bleeding Lungs: Clear to auscultate bilaterally CVS: Regular rate and rhythm, no murmur GI/Abd soft, nontender, nondistended, bowel sound present CNS: Alert, awake, oriented x3.  Generalized weakness present Psychiatry: Depressed look Extremities: No pedal edema, no calf tenderness  Data Review: I have personally reviewed the laboratory data and studies available.  Recent Labs  Lab 07/21/20 1957 07/22/20 0431 07/23/20 0349  WBC 6.3 3.5* 12.8*  NEUTROABS 4.1 2.7 10.7*  HGB 13.0 12.1 13.2  HCT 41.0 37.7 43.2  MCV 85.4 84.5 88.2  PLT 252 248 328   Recent Labs  Lab 07/21/20 1957 07/22/20 0431 07/23/20 0349  NA 138 136 140  K 4.0 3.9 4.2  CL 99 102 106  CO2 26 24 21*  GLUCOSE 95 156* 134*  BUN 9 10 13   CREATININE 0.78 0.68 0.68  CALCIUM 8.5* 8.4* 8.8*   No results found for: HGBA1C     Component Value Date/Time   TRIG 102 07/21/2020 1957   Signed, Terrilee Croak, MD [facility] Pager: [phone] (Secure Chat preferred). 07/23/2020

## 2020-07-24 LAB — CBC WITH DIFFERENTIAL/PLATELET
Abs Immature Granulocytes: 0.14 10*3/uL — ABNORMAL HIGH (ref 0.00–0.07)
Basophils Absolute: 0 10*3/uL (ref 0.0–0.1)
Basophils Relative: 0 %
Eosinophils Absolute: 0 10*3/uL (ref 0.0–0.5)
Eosinophils Relative: 0 %
HCT: 40.8 % (ref 36.0–46.0)
Hemoglobin: 12.8 g/dL (ref 12.0–15.0)
Immature Granulocytes: 1 %
Lymphocytes Relative: 12 %
Lymphs Abs: 2.2 10*3/uL (ref 0.7–4.0)
MCH: 27.2 pg (ref 26.0–34.0)
MCHC: 31.4 g/dL (ref 30.0–36.0)
MCV: 86.6 fL (ref 80.0–100.0)
Monocytes Absolute: 1.1 10*3/uL — ABNORMAL HIGH (ref 0.1–1.0)
Monocytes Relative: 6 %
Neutro Abs: 15.6 10*3/uL — ABNORMAL HIGH (ref 1.7–7.7)
Neutrophils Relative %: 81 %
Platelets: 386 10*3/uL (ref 150–400)
RBC: 4.71 MIL/uL (ref 3.87–5.11)
RDW: 14.5 % (ref 11.5–15.5)
WBC: 19.1 10*3/uL — ABNORMAL HIGH (ref 4.0–10.5)
nRBC: 0 % (ref 0.0–0.2)

## 2020-07-24 LAB — COMPREHENSIVE METABOLIC PANEL
ALT: 28 U/L (ref 0–44)
AST: 20 U/L (ref 15–41)
Albumin: 3.2 g/dL — ABNORMAL LOW (ref 3.5–5.0)
Alkaline Phosphatase: 61 U/L (ref 38–126)
Anion gap: 11 (ref 5–15)
BUN: 16 mg/dL (ref 6–20)
CO2: 23 mmol/L (ref 22–32)
Calcium: 8.6 mg/dL — ABNORMAL LOW (ref 8.9–10.3)
Chloride: 105 mmol/L (ref 98–111)
Creatinine, Ser: 0.67 mg/dL (ref 0.44–1.00)
GFR calc Af Amer: >60 mL/min (ref >60)
GFR calc non Af Amer: >60 mL/min (ref >60)
Glucose, Bld: 121 mg/dL — ABNORMAL HIGH (ref 70–99)
Potassium: 4 mmol/L (ref 3.5–5.1)
Sodium: 139 mmol/L (ref 135–145)
Total Bilirubin: 0.5 mg/dL (ref 0.3–1.2)
Total Protein: 6.9 g/dL (ref 6.5–8.1)

## 2020-07-24 LAB — FERRITIN: Ferritin: 518 ng/mL — ABNORMAL HIGH (ref 11–307)

## 2020-07-24 LAB — CULTURE, BLOOD (ROUTINE X 2): Special Requests: ADEQUATE

## 2020-07-24 LAB — C-REACTIVE PROTEIN: CRP: 1.9 mg/dL — ABNORMAL HIGH (ref <1.0)

## 2020-07-24 LAB — D-DIMER, QUANTITATIVE: D-Dimer, Quant: 0.55 ug/mL-FEU — ABNORMAL HIGH (ref 0.00–0.50)

## 2020-07-24 MED ORDER — POLYETHYLENE GLYCOL 3350 17 G PO PACK
17.0000 g | PACK | Freq: Every day | ORAL | Status: DC | PRN
  Administered 2020-07-24 – 2020-07-25 (×2): 17 g via ORAL
  Filled 2020-07-24 (×2): qty 1

## 2020-07-24 NOTE — Progress Notes (Signed)
PROGRESS NOTE  Terri Gibson  DOB: 04-30-1976  PCP: Patient, [name] [id number]  DOA: 07/21/2020  LOS: 3 days   Chief Complaint  Patient presents with  . covid positive  . Shortness of Breath   Brief narrative: Terri Gibson is a 44 y.o. female with no significant past medical history who presented to the ED on 8/25 with complaint of shortness of breath.   8/18, patient was diagnosed with COVID-19 infection.  She had headache and upper respiratory symptoms at that time.  Her symptoms progressively worsened to dyspnea and cough even at rest.  In the ED, patient was afebrile, heart rate 102, respiratory rate high 20s to low 30s, oxygen saturation more than 90% on 3 L by nasal cannula. WBC normal, CRP elevated to 14.2, lactic acid, procalcitonin normal. Chest x-ray showed hazy infiltrates in the mid and lower lungs consistent with viral pneumonia. Patient was hospitalized for COVID-19 pneumonia. She has not been vaccinated against Covid.  Subjective: Patient was seen and examined this morning. On room air this morning.  Feels much better. Slept well last night. Labs from this morning shows improving CRP, D-dimer, ferritin level.  Assessment/Plan: COVID pneumonia Acute respiratory failure with hypoxia  -Presented with worsening shortness of breath -COVID test: PCR positive as an outpatient on 8/18 -Chest imaging: Chest x-ray on admission with hazy infiltrates in the mid and lower lungs  -Treatment: Patient is currently on a 5-day course of IV remdesivir to complete on 8/29.  Also on Solu-Medrol 40 mg twice daily.  We will start tapering from tomorrow. Continue the same for now. Does not require Actemra or baricitinib at this time. -Protonix while on steroids. -Supportive care: Vitamin C, Zinc, PRN inhalers, Tylenol, antitussives. We will make benzonatate scheduled. -Encouraged incentive spirometry, out of bed and early mobilization as much as possible  -Progression:  Feels much better this morning.  On room air at rest.  May still need oxygen on ambulation. -Continue airborne/contact isolation precautions for duration of 3 weeks from the day of diagnosis. -WBC and inflammatory markers trend as below. Improving CRP, D-dimer.  Lab Results  Component Value Date   SARSCOV2NAA POSITIVE (A) 07/14/2020   [place] Not Detected 12/08/2019   [place] Not Detected 10/28/2019   [place] Not Detected 08/14/2019    Recent Labs  Lab 07/21/20 1957 07/22/20 0431 07/22/20 0438 07/23/20 0349 07/24/20 0444  WBC 6.3 3.5*  --  12.8* 19.1*  LATICACIDVEN 1.3  --   --   --   --   PROCALCITON <0.10  --   --   --   --   DDIMER 0.98* 1.03*  --  0.79* 0.55*  FERRITIN 558*  --  510* 548* 518*  LDH 321*  --   --   --   --   CRP 14.2* 13.4*  --  7.4* 1.9*  ALT 22 27  --  30 28   The treatment plan and use of medications and known side effects were discussed with patient/family. Some of the medications used are based on case reports/anecdotal data.  All other medications being used in the management of COVID-19 based on limited study data.  Complete risks and long-term side effects are unknown, however in the best clinical judgment they seem to be of some benefit.  Patient wanted to proceed with treatment options provided.  Gram-positive bacteremia -Pulmonary blood culture showing staph epidermidis. True infection versus contamination.  -Started on vancomycin last night. Continue the same. -Repeat blood culture  ordered today.  Anxiety/insomnia -Improving after she was started on PRN Xanax and melatonin  Mobility: Encourage early mobilization Code Status:   Code Status: Full Code  Nutritional status: Body mass index is 38.52 kg/m.     Diet Order            Diet regular Room service appropriate? Yes; Fluid consistency: Thin  Diet effective now                 DVT prophylaxis: Lovenox subcu   Antimicrobials:  IV remdesivir  Fluid:  None  Consultants: None Family Communication:  None at bedside  Status is: Inpatient  Remains inpatient appropriate because:Ongoing diagnostic testing needed not appropriate for outpatient work up and IV treatments appropriate due to intensity of illness or inability to take PO   Dispo: The patient is from: Home              Anticipated d/c is to: Home              Anticipated d/c date is: 3 days              Patient currently is not medically stable to d/c.       Infusions:  . remdesivir 100 mg in NS 100 mL 100 mg (07/24/20 1002)  . vancomycin 1,250 mg (07/24/20 1254)    Scheduled Meds: . vitamin C  500 mg Oral Daily  . benzonatate  200 mg Oral TID  . enoxaparin (LOVENOX) injection  50 mg Subcutaneous QHS  . melatonin  3 mg Oral QHS  . methylPREDNISolone (SOLU-MEDROL) injection  40 mg Intravenous Q12H  . zinc sulfate  220 mg Oral Daily    Antimicrobials: Anti-infectives (From admission, onward)   Start     Dose/Rate Route Frequency Ordered Stop   07/23/20 1200  vancomycin (VANCOREADY) IVPB 1250 mg/250 mL        1,250 mg 166.7 mL/hr over 90 Minutes Intravenous Every 12 hours 07/23/20 0052     07/22/20 2345  vancomycin (VANCOREADY) IVPB 2000 mg/400 mL        2,000 mg 200 mL/hr over 120 Minutes Intravenous  Once 07/22/20 2334 07/23/20 0754   07/22/20 1000  remdesivir 100 mg in sodium chloride 0.9 % 100 mL IVPB        100 mg 200 mL/hr over 30 Minutes Intravenous Daily 07/21/20 2154 07/26/20 0959   07/21/20 2200  remdesivir 200 mg in sodium chloride 0.9% 250 mL IVPB        200 mg 580 mL/hr over 30 Minutes Intravenous Once 07/21/20 2154 07/21/20 2250      PRN meds: acetaminophen, ALPRAZolam, guaiFENesin-dextromethorphan, ondansetron **OR** ondansetron (ZOFRAN) IV   Objective: Vitals:   07/24/20 0604 07/24/20 1351  BP: 117/73 122/86  Pulse: 63 71  Resp: 19 20  Temp: 97.8 F (36.6 C) 98.6 F (37 C)  SpO2: 98% 94%    Intake/Output Summary (Last 24 hours) at  07/24/2020 1531 Last data filed at 07/23/2020 1800 Gross per 24 hour  Intake 240 ml  Output --  Net 240 ml   Filed Weights   07/22/20 2024  Weight: 105 kg   Weight change:  Body mass index is 38.52 kg/m.   Physical Exam: General exam: Appears calm and comfortable. Feels tired. Not in physical distress. Skin: No rashes, lesions or ulcers. HEENT: Atraumatic, normocephalic, supple neck, no obvious bleeding Lungs: Clear to auscultation bilaterally CVS: Regular rate and rhythm, no murmur GI/Abd soft, nontender, nondistended, bowel sound present  CNS: Alert, awake, oriented x3.  Generalized weakness present Psychiatry: Normal mood Extremities: No pedal edema, no calf tenderness  Data Review: I have personally reviewed the laboratory data and studies available.  Recent Labs  Lab 07/21/20 1957 07/22/20 0431 07/23/20 0349 07/24/20 0444  WBC 6.3 3.5* 12.8* 19.1*  NEUTROABS 4.1 2.7 10.7* 15.6*  HGB 13.0 12.1 13.2 12.8  HCT 41.0 37.7 43.2 40.8  MCV 85.4 84.5 88.2 86.6  PLT 252 248 328 386   Recent Labs  Lab 07/21/20 1957 07/22/20 0431 07/23/20 0349 07/24/20 0444  NA 138 136 140 139  K 4.0 3.9 4.2 4.0  CL 99 102 106 105  CO2 26 24 21* 23  GLUCOSE 95 156* 134* 121*  BUN 9 10 13 16   CREATININE 0.78 0.68 0.68 0.67  CALCIUM 8.5* 8.4* 8.8* 8.6*   No results found for: HGBA1C     Component Value Date/Time   TRIG 102 07/21/2020 1957   Signed, Terrilee Croak, MD [facility] Pager: [phone] (Secure Chat preferred). 07/24/2020

## 2020-07-25 LAB — COMPREHENSIVE METABOLIC PANEL
ALT: 26 U/L (ref 0–44)
AST: 16 U/L (ref 15–41)
Albumin: 3.2 g/dL — ABNORMAL LOW (ref 3.5–5.0)
Alkaline Phosphatase: 56 U/L (ref 38–126)
Anion gap: 7 (ref 5–15)
BUN: 15 mg/dL (ref 6–20)
CO2: 23 mmol/L (ref 22–32)
Calcium: 8.5 mg/dL — ABNORMAL LOW (ref 8.9–10.3)
Chloride: 109 mmol/L (ref 98–111)
Creatinine, Ser: 0.68 mg/dL (ref 0.44–1.00)
GFR calc Af Amer: >60 mL/min (ref >60)
GFR calc non Af Amer: >60 mL/min (ref >60)
Glucose, Bld: 124 mg/dL — ABNORMAL HIGH (ref 70–99)
Potassium: 4.2 mmol/L (ref 3.5–5.1)
Sodium: 139 mmol/L (ref 135–145)
Total Bilirubin: 0.3 mg/dL (ref 0.3–1.2)
Total Protein: 6.6 g/dL (ref 6.5–8.1)

## 2020-07-25 LAB — CBC WITH DIFFERENTIAL/PLATELET
Abs Immature Granulocytes: 0.34 10*3/uL — ABNORMAL HIGH (ref 0.00–0.07)
Basophils Absolute: 0 10*3/uL (ref 0.0–0.1)
Basophils Relative: 0 %
Eosinophils Absolute: 0 10*3/uL (ref 0.0–0.5)
Eosinophils Relative: 0 %
HCT: 39.7 % (ref 36.0–46.0)
Hemoglobin: 12.7 g/dL (ref 12.0–15.0)
Immature Granulocytes: 2 %
Lymphocytes Relative: 13 %
Lymphs Abs: 2.6 10*3/uL (ref 0.7–4.0)
MCH: 27.3 pg (ref 26.0–34.0)
MCHC: 32 g/dL (ref 30.0–36.0)
MCV: 85.2 fL (ref 80.0–100.0)
Monocytes Absolute: 0.9 10*3/uL (ref 0.1–1.0)
Monocytes Relative: 5 %
Neutro Abs: 15.9 10*3/uL — ABNORMAL HIGH (ref 1.7–7.7)
Neutrophils Relative %: 80 %
Platelets: 413 10*3/uL — ABNORMAL HIGH (ref 150–400)
RBC: 4.66 MIL/uL (ref 3.87–5.11)
RDW: 14.6 % (ref 11.5–15.5)
WBC: 19.8 10*3/uL — ABNORMAL HIGH (ref 4.0–10.5)
nRBC: 0 % (ref 0.0–0.2)

## 2020-07-25 LAB — C-REACTIVE PROTEIN: CRP: 1 mg/dL — ABNORMAL HIGH (ref <1.0)

## 2020-07-25 LAB — D-DIMER, QUANTITATIVE: D-Dimer, Quant: 0.61 ug/mL-FEU — ABNORMAL HIGH (ref 0.00–0.50)

## 2020-07-25 MED ORDER — PANTOPRAZOLE SODIUM 40 MG PO TBEC: 40.0000 mg | DELAYED_RELEASE_TABLET | Freq: Every day | ORAL | 0 refills | Status: AC

## 2020-07-25 MED ORDER — PREDNISONE 10 MG PO TABS: ORAL_TABLET | ORAL | 0 refills | Status: AC

## 2020-07-25 MED ORDER — BENZONATATE 200 MG PO CAPS: 200.0000 mg | ORAL_CAPSULE | Freq: Three times a day (TID) | ORAL | 0 refills | Status: DC

## 2020-07-25 NOTE — Discharge Instructions (Signed)
Follow with Primary MD Patient, [name] in 7 days   Get CBC/BMP checked in next visit within 1 week by PCP or SNF MD ( we routinely change or add medications that can affect your baseline labs and fluid status, therefore we recommend that you get the mentioned basic workup next visit with your PCP, your PCP may decide not to get them or add new tests based on their clinical decision)  On your next visit with your PCP, please Get Medicines reviewed and adjusted.  Please request your PCP  to go over all Hospital Tests and Procedure/Radiological results at the follow up, please get all Hospital records sent to your Prim MD by signing hospital release before you go home.  Activity: As tolerated with Full fall precautions use walker/cane & assistance as needed  For Heart failure patients - Check your Weight same time everyday, if you gain over 2 pounds, or you develop in leg swelling, experience more shortness of breath or chest pain, call your Primary MD immediately. Follow Cardiac Low Salt Diet and 1.5 lit/day fluid restriction.  If you have smoked or chewed Tobacco in the last 2 yrs please stop smoking, stop any regular Alcohol  and or any Recreational drug use.  If you experience worsening of your admission symptoms, develop shortness of breath, life threatening emergency, suicidal or homicidal thoughts you must seek medical attention immediately by calling 911 or calling your MD immediately  if symptoms less severe.  You Must read complete instructions/literature along with all the possible adverse reactions/side effects for all the Medicines you take and that have been prescribed to you. Take any new Medicines after you have completely understood and accpet all the possible adverse reactions/side effects.   Do not drive, operate heavy machinery, perform activities at heights, swimming or participation in water activities or provide baby sitting services if your were admitted for  syncope or siezures until you have seen by Primary MD or a Neurologist and advised to do so again.  Do not drive when taking Pain medications.  Do not take more than prescribed Pain, Sleep and Anxiety Medications  Wear Seat belts while driving.   Please note You were cared for by a hospitalist during your hospital stay. If you have any questions about your discharge medications or the care you received while you were in the hospital after you are discharged, you can call the unit and asked to speak with the hospitalist on call if the hospitalist that took care of you is not available. Once you are discharged, your primary care physician will handle any further medical issues. Please note that NO REFILLS for any discharge medications will be authorized once you are discharged, as it is imperative that you return to your primary care physician (or establish a relationship with a primary care physician if you do not have one) for your aftercare needs so that they can reassess your need for medications and monitor your lab values.

## 2020-07-25 NOTE — Discharge Summary (Signed)
Physician Discharge Summary  Terri Gibson YBO:[id number] DOB: 12-Sep-1976 DOA: 07/21/2020  PCP: Patient, [name]  Admit date: 07/21/2020 Discharge date: 07/25/2020  Admitted From: Home Discharge disposition: Home   Code Status: Full Code  Diet Recommendation: Regular diet  Discharge Diagnosis:   Principal Problem:   Acute respiratory failure due to COVID-19 [facility]) Active Problems:   Acute respiratory disease due to COVID-19 virus   History of Present Illness / Brief narrative:  Terri Gibson is a 44 y.o. female with no significant past medical history who presented to the ED on 8/25 with complaint of shortness of breath.   8/18, patient was diagnosed with COVID-19 infection.  She had headache and upper respiratory symptoms at that time.  Her symptoms progressively worsened to dyspnea and cough even at rest.  In the ED, patient was afebrile, heart rate 102, respiratory rate high 20s to low 30s, oxygen saturation more than 90% on 3 L by nasal cannula. WBC normal, CRP elevated to 14.2, lactic acid, procalcitonin normal. Chest x-ray showed hazy infiltrates in the mid and lower lungs consistent with viral pneumonia. Patient was hospitalized for COVID-19 pneumonia. She has not been vaccinated against Covid.  Subjective:  Seen and examined this morning.   Lying on bed.  Not on supplemental oxygen.  Able to move around in the room without supplemental oxygen maintaining saturations more than 90%. Feels better.  Wants to go home.  Hospital Course:  COVID pneumonia Acute respiratory failure with hypoxia  -Presented with worsening shortness of breath -COVID test: PCR positive as an outpatient on 8/18 -Chest imaging: Chest x-ray on admission with hazy infiltrates in the mid and lower lungs  -Treatment: Patient completed 5-day course of IV remdesivir today. Currently on Solu-Medrol 40 mg twice daily.   -Feels much better.  Not on supplemental oxygen at rest ambulation.  Wants  to go home. -We will discharge on tapering course of prednisone and antitussives. -Protonix while on steroids.  -Continue airborne/contact isolation precautions for duration of 3 weeks from the day of diagnosis. -WBC and inflammatory markers improving trend as below.  Lab Results  Component Value Date   SARSCOV2NAA POSITIVE (A) 07/14/2020   [place] Not Detected 12/08/2019   [place] Not Detected 10/28/2019   [place] Not Detected 08/14/2019    Recent Labs  Lab 07/21/20 1957 07/22/20 0431 07/22/20 0438 07/23/20 0349 07/24/20 0444 07/25/20 0639  WBC 6.3 3.5*  --  12.8* 19.1* 19.8*  LATICACIDVEN 1.3  --   --   --   --   --   PROCALCITON <0.10  --   --   --   --   --   DDIMER 0.98* 1.03*  --  0.79* 0.55* 0.61*  FERRITIN 558*  --  510* 548* 518*  --   LDH 321*  --   --   --   --   --   CRP 14.2* 13.4*  --  7.4* 1.9* 1.0*  ALT 22 27  --  30 28 26    The treatment plan and use of medications and known side effects were discussed with patient/family. Some of the medications used are based on case reports/anecdotal data. All other medications being used in the management of COVID-19 based on limited study data. Complete risks and long-term side effects are unknown, however in the best clinical judgment they seem to be of some benefit. Patientwanted to proceed with treatment options provided.  Gram-positive bacteremia -1 out of 2 blood culture sent on  admission showed staph epidermidis.  Likely contamination.  -Empirically started on vancomycin. -Repeat blood culture sent on 8/27 did not show any growth. -No need to continue antibiotics.  Okay to discharge home  Anxiety/insomnia -Improving after she was started on PRN Xanax and melatonin  Mobility: Encourage early mobilization Code Status:  Code Status: Full Code      Discharge Exam:   Vitals:   07/25/20 0030 07/25/20 0345 07/25/20 0547 07/25/20 0900  BP:   114/70   Pulse:   65   Resp:   16   Temp:   98 F  (36.7 C)   TempSrc:      SpO2: 92% 93% 97% 96%  Weight:      Height:        Body mass index is 38.52 kg/m.  General exam: Appears calm and comfortable.  Not in physical distress Skin: No rashes, lesions or ulcers. HEENT: Atraumatic, normocephalic, supple neck, no obvious bleeding Lungs: Clear to auscultation bilaterally CVS: Regular rate and rhythm, no murmur GI/Abd soft, nontender, nondistended, bowel sound present CNS: Alert, awake, oriented x3 Psychiatry: Mood appropriate Extremities: No pedal edema, no calf tenderness  Follow ups:     Follow-up [place] Follow up.   Contact information: [address] [place] [address] [phone]              Recommendations for Outpatient Follow-Up:   1. Follow-up PCP as an outpatient  Discharge Instructions:  Follow with Primary MD Patient, [name] in 7 days   Get CBC/BMP checked in next visit within 1 week by PCP or SNF MD ( we routinely change or add medications that can affect your baseline labs and fluid status, therefore we recommend that you get the mentioned basic workup next visit with your PCP, your PCP may decide not to get them or add new tests based on their clinical decision)  On your next visit with your PCP, please Get Medicines reviewed and adjusted.  Please request your PCP  to go over all Hospital Tests and Procedure/Radiological results at the follow up, please get all Hospital records sent to your Prim MD by signing hospital release before you go home.  Activity: As tolerated with Full fall precautions use walker/cane & assistance as needed  For Heart failure patients - Check your Weight same time everyday, if you gain over 2 pounds, or you develop in leg swelling, experience more shortness of breath or chest pain, call your Primary MD immediately. Follow Cardiac Low Salt Diet and 1.5 lit/day fluid restriction.  If you have  smoked or chewed Tobacco in the last 2 yrs please stop smoking, stop any regular Alcohol  and or any Recreational drug use.  If you experience worsening of your admission symptoms, develop shortness of breath, life threatening emergency, suicidal or homicidal thoughts you must seek medical attention immediately by calling 911 or calling your MD immediately  if symptoms less severe.  You Must read complete instructions/literature along with all the possible adverse reactions/side effects for all the Medicines you take and that have been prescribed to you. Take any new Medicines after you have completely understood and accpet all the possible adverse reactions/side effects.   Do not drive, operate heavy machinery, perform activities at heights, swimming or participation in water activities or provide baby sitting services if your were admitted for syncope or siezures until you have seen by Primary MD or a Neurologist and  advised to do so again.  Do not drive when taking Pain medications.  Do not take more than prescribed Pain, Sleep and Anxiety Medications  Wear Seat belts while driving.   Please note You were cared for by a hospitalist during your hospital stay. If you have any questions about your discharge medications or the care you received while you were in the hospital after you are discharged, you can call the unit and asked to speak with the hospitalist on call if the hospitalist that took care of you is not available. Once you are discharged, your primary care physician will handle any further medical issues. Please note that NO REFILLS for any discharge medications will be authorized once you are discharged, as it is imperative that you return to your primary care physician (or establish a relationship with a primary care physician if you do not have one) for your aftercare needs so that they can reassess your need for medications and monitor your lab values.    Allergies as of 07/25/2020        Reactions   Hydrocodone-ibuprofen Itching   Rabbit Protein Swelling   Can't eat made whole body swell    Tomato Hives      Medication List    STOP taking these medications   naproxen sodium 220 MG tablet Commonly known as: ALEVE     TAKE these medications   acetaminophen 500 MG tablet Commonly known as: TYLENOL Take 500 mg by mouth every 6 (six) hours as needed for moderate pain, fever or headache.   benzonatate 200 MG capsule Commonly known as: TESSALON Take 1 capsule (200 mg total) by mouth 3 (three) times daily.   ondansetron 4 MG disintegrating tablet Commonly known as: ZOFRAN-ODT Take 1 tablet (4 mg total) by mouth every 8 (eight) hours as needed for nausea or vomiting.   pantoprazole 40 MG tablet Commonly known as: Protonix Take 1 tablet (40 mg total) by mouth daily for 10 days.   predniSONE 10 MG tablet Commonly known as: DELTASONE Take 4 tabs twice a day for 2 days, then take 4 tabs daily for 2 days, then take 3 tabs daily for 2 days, then take 2 tabs daily for 2 days, then take 1 tab daily for 2 days, then STOP.       Time coordinating discharge: 35 minutes  The results of significant diagnostics from this hospitalization (including imaging, microbiology, ancillary and laboratory) are listed below for reference.    Procedures and Diagnostic Studies:   DG Chest Portable 1 View  Result Date: 07/21/2020 CLINICAL DATA:  Coronavirus infection.  Shortness of breath. EXAM: PORTABLE CHEST 1 VIEW COMPARISON:  11/19/2018 FINDINGS: Heart size is normal. Hazy infiltrates in the mid and lower lungs consistent with viral pneumonia. No dense consolidation, collapse or effusion. No acute bone finding. IMPRESSION: Hazy infiltrates in the mid and lower lungs consistent with viral pneumonia. Electronically Signed   By: Nelson Chimes M.D.   On: 07/21/2020 18:06     Labs:   Basic Metabolic Panel: Recent Labs  Lab 07/21/20 1957 07/21/20 1957 07/22/20 0431  07/22/20 0431 07/23/20 0349 07/23/20 0349 07/24/20 0444 07/25/20 0639  NA 138  --  136  --  140  --  139 139  K 4.0   < > 3.9   < > 4.2   < > 4.0 4.2  CL 99  --  102  --  106  --  105 109  CO2 26  --  24  --  21*  --  23 23  GLUCOSE 95  --  156*  --  134*  --  121* 124*  BUN 9  --  10  --  13  --  16 15  CREATININE 0.78  --  0.68  --  0.68  --  0.67 0.68  CALCIUM 8.5*  --  8.4*  --  8.8*  --  8.6* 8.5*   < > = values in this interval not displayed.   GFR Estimated Creatinine Clearance: 108 mL/min (by C-G formula based on SCr of 0.68 mg/dL). Liver Function Tests: Recent Labs  Lab 07/21/20 1957 07/22/20 0431 07/23/20 0349 07/24/20 0444 07/25/20 0639  AST 29 38 30 20 16   ALT 22 27 30 28 26   ALKPHOS 55 53 59 61 56  BILITOT 0.7 0.4 0.4 0.5 0.3  PROT 7.7 7.4 7.7 6.9 6.6  ALBUMIN 3.9 3.4* 3.6 3.2* 3.2*   No results for input(s): LIPASE, AMYLASE in the last 168 hours. No results for input(s): AMMONIA in the last 168 hours. Coagulation profile No results for input(s): INR, PROTIME in the last 168 hours.  CBC: Recent Labs  Lab 07/21/20 1957 07/22/20 0431 07/23/20 0349 07/24/20 0444 07/25/20 0639  WBC 6.3 3.5* 12.8* 19.1* 19.8*  NEUTROABS 4.1 2.7 10.7* 15.6* 15.9*  HGB 13.0 12.1 13.2 12.8 12.7  HCT 41.0 37.7 43.2 40.8 39.7  MCV 85.4 84.5 88.2 86.6 85.2  PLT 252 248 328 386 413*   Cardiac Enzymes: No results for input(s): CKTOTAL, CKMB, CKMBINDEX, TROPONINI in the last 168 hours. BNP: Invalid input(s): POCBNP CBG: No results for input(s): GLUCAP in the last 168 hours. D-Dimer Recent Labs    07/24/20 0444 07/25/20 0639  DDIMER 0.55* 0.61*   Hgb A1c No results for input(s): HGBA1C in the last 72 hours. Lipid Profile No results for input(s): CHOL, HDL, LDLCALC, TRIG, CHOLHDL, LDLDIRECT in the last 72 hours. Thyroid function studies No results for input(s): TSH, T4TOTAL, T3FREE, THYROIDAB in the last 72 hours.  Invalid input(s): FREET3 Anemia work up Recent  Labs    07/23/20 0349 07/24/20 0444  FERRITIN 548* 518*   Microbiology Recent Results (from the past 240 hour(s))  Blood Culture (routine x 2)     Status: Abnormal   Collection Time: 07/21/20  7:57 PM   Specimen: BLOOD  Result Value Ref Range Status   Specimen Description   Final    BLOOD RIGHT ANTECUBITAL Performed at [facility] [address], [place], [place] [address]    Special Requests   Final    BOTTLES DRAWN AEROBIC AND ANAEROBIC Blood Culture adequate volume Performed at [facility] [address], [place], [place] [address]    Culture  Setup Time   Final    GRAM POSITIVE COCCI IN CLUSTERS IN BOTH AEROBIC AND ANAEROBIC BOTTLES Organism ID to follow CRITICAL RESULT CALLED TO, READ BACK BY AND VERIFIED WITH: NICK GLOGOVAC PHARMD @2253  07/22/20 EB    Culture (A)  Final    STAPHYLOCOCCUS EPIDERMIDIS THE SIGNIFICANCE OF ISOLATING THIS ORGANISM FROM A SINGLE SET OF BLOOD CULTURES WHEN MULTIPLE SETS ARE DRAWN IS UNCERTAIN. PLEASE NOTIFY THE MICROBIOLOGY DEPARTMENT WITHIN ONE WEEK IF SPECIATION AND SENSITIVITIES ARE REQUIRED. Performed at [facility], [place] [address], [place], [place] [address]    Report Status 07/24/2020 FINAL  Final  Blood Culture (routine x 2)     Status: None (Preliminary result)   Collection Time: 07/21/20  7:57 PM   Specimen: BLOOD  Result Value Ref Range Status   Specimen Description   Final    BLOOD LEFT ANTECUBITAL Performed at [facility] [address] [place], [place] [address]    Special Requests   Final    BOTTLES DRAWN AEROBIC AND ANAEROBIC Blood Culture adequate volume Performed at [facility] [address] [place], [place] [address]    Culture   Final    NO GROWTH 3 DAYS Performed at [facility], [place] [address], [place], [place] [address]    Report Status PENDING  Incomplete  Blood Culture ID Panel (Reflexed)     Status: Abnormal    Collection Time: 07/21/20  7:57 PM  Result Value Ref Range Status   Enterococcus faecalis NOT DETECTED NOT DETECTED Final   Enterococcus Faecium NOT DETECTED NOT DETECTED Final   Listeria monocytogenes NOT DETECTED NOT DETECTED Final   Staphylococcus species DETECTED (A) NOT DETECTED Final    Comment: RESULT CALLED TO, READ BACK BY AND VERIFIED WITH: NICK GLOGOVAC PHARMD @2252  07/22/20 EB    Staphylococcus aureus (BCID) NOT DETECTED NOT DETECTED Final   Staphylococcus epidermidis DETECTED (A) NOT DETECTED Final    Comment: RESULT CALLED TO, READ BACK BY AND VERIFIED WITH: NICK GLOGOVAC PHARMD @2252  07/22/20 EB    Staphylococcus lugdunensis NOT DETECTED NOT DETECTED Final   Streptococcus species NOT DETECTED NOT DETECTED Final   Streptococcus agalactiae NOT DETECTED NOT DETECTED Final   Streptococcus pneumoniae NOT DETECTED NOT DETECTED Final   Streptococcus pyogenes NOT DETECTED NOT DETECTED Final   A.calcoaceticus-baumannii NOT DETECTED NOT DETECTED Final   Bacteroides fragilis NOT DETECTED NOT DETECTED Final   Enterobacterales NOT DETECTED NOT DETECTED Final   Enterobacter cloacae complex NOT DETECTED NOT DETECTED Final   Escherichia coli NOT DETECTED NOT DETECTED Final   Klebsiella aerogenes NOT DETECTED NOT DETECTED Final   Klebsiella oxytoca NOT DETECTED NOT DETECTED Final   Klebsiella pneumoniae NOT DETECTED NOT DETECTED Final   Proteus species NOT DETECTED NOT DETECTED Final   Salmonella species NOT DETECTED NOT DETECTED Final   Serratia marcescens NOT DETECTED NOT DETECTED Final   Haemophilus influenzae NOT DETECTED NOT DETECTED Final   Neisseria meningitidis NOT DETECTED NOT DETECTED Final   Pseudomonas aeruginosa NOT DETECTED NOT DETECTED Final   Stenotrophomonas maltophilia NOT DETECTED NOT DETECTED Final   Candida albicans NOT DETECTED NOT DETECTED Final   Candida auris NOT DETECTED NOT DETECTED Final   Candida glabrata NOT DETECTED NOT DETECTED Final   Candida krusei  NOT DETECTED NOT DETECTED Final   Candida parapsilosis NOT DETECTED NOT DETECTED Final   Candida tropicalis NOT DETECTED NOT DETECTED Final   Cryptococcus neoformans/gattii NOT DETECTED NOT DETECTED Final   Methicillin resistance mecA/C NOT DETECTED NOT DETECTED Final    Comment: Performed at [facility], [address] [place], [place] [address]  Culture, blood (routine x 2)     Status: None (Preliminary result)   Collection Time: 07/23/20  9:45 AM   Specimen: BLOOD LEFT HAND  Result Value Ref Range Status   Specimen Description   Final    BLOOD LEFT HAND Performed at [facility] [address], [place], [place] [address]    Special Requests   Final    BOTTLES DRAWN AEROBIC ONLY Blood Culture results may not be optimal due to an inadequate volume of blood received in culture bottles Performed at [facility]  [address] [place], [place] [address]    Culture   Final    NO GROWTH 1 DAY Performed at [facility] [address], [place], [place] [address]    Report Status PENDING  Incomplete  Culture, blood (routine x 2)     Status: None (Preliminary result)   Collection Time: 07/23/20  9:45 AM   Specimen: BLOOD RIGHT HAND  Result Value Ref Range Status   Specimen Description   Final    BLOOD RIGHT HAND Performed at [facility] [address] [place], [place] [address]    Special Requests   Final    BOTTLES DRAWN AEROBIC ONLY Blood Culture adequate volume Performed at [facility] [address], [place], [place] [address]    Culture   Final    NO GROWTH 1 DAY Performed at [facility], [place] [address] [place], [place] [address]    Report Status PENDING  Incomplete     Signed: Marlowe Aschoff Freedom Lopezperez  [facility] 07/25/2020, 11:45 AM

## 2020-07-25 NOTE — Progress Notes (Signed)
SATURATION QUALIFICATIONS: (This note is used to comply with regulatory documentation for home oxygen)  Patient Saturations on Room Air at Rest = 96 %  Patient Saturations on Room Air while Ambulating = 92 %  

## 2020-07-25 NOTE — Plan of Care (Signed)
Care plan outcome-adequate for discharge

## 2020-07-26 LAB — CULTURE, BLOOD (ROUTINE X 2)
Culture: NO GROWTH
Special Requests: ADEQUATE

## 2020-07-28 LAB — CULTURE, BLOOD (ROUTINE X 2)
Culture: NO GROWTH
Culture: NO GROWTH
Special Requests: ADEQUATE

## 2022-03-17 ENCOUNTER — Emergency Department (HOSPITAL_COMMUNITY)
Admission: EM | Admit: 2022-03-17 | Discharge: 2022-03-17 | Disposition: A | Discharge status: Home / Self Care | Payer: BC Managed Care – PPO | Attending: Emergency Medicine | Admitting: Emergency Medicine

## 2022-03-17 ENCOUNTER — Other Ambulatory Visit: Payer: Self-pay

## 2022-03-17 ENCOUNTER — Encounter (HOSPITAL_COMMUNITY): Payer: Self-pay | Admitting: Emergency Medicine

## 2022-03-17 ENCOUNTER — Emergency Department (HOSPITAL_COMMUNITY): Payer: BC Managed Care – PPO

## 2022-03-17 DIAGNOSIS — J069 Acute upper respiratory infection, unspecified: Secondary | ICD-10-CM | POA: Insufficient documentation

## 2022-03-17 DIAGNOSIS — Z20822 Contact with and (suspected) exposure to covid-19: Secondary | ICD-10-CM | POA: Insufficient documentation

## 2022-03-17 DIAGNOSIS — J029 Acute pharyngitis, unspecified: Secondary | ICD-10-CM | POA: Diagnosis present

## 2022-03-17 LAB — RESP PANEL BY RT-PCR (FLU A&B, COVID) ARPGX2
Influenza A by PCR: NEGATIVE
Influenza B by PCR: NEGATIVE
SARS Coronavirus 2 by RT PCR: NEGATIVE

## 2022-03-17 MED ORDER — BENZONATATE 100 MG PO CAPS: 100.0000 mg | ORAL_CAPSULE | Freq: Three times a day (TID) | ORAL | 0 refills | Status: AC | PRN

## 2022-03-17 NOTE — ED Triage Notes (Signed)
Pt arriving POV with complaint of dry cough, chest congestion, sore throat, and ringing in her ears. Pt reports symptoms began approx 1 week ago. Pt reports taking Mucinex and DayQuil with no relief. No fever. ?

## 2022-03-17 NOTE — Discharge Instructions (Addendum)
Please check the results of your respiratory panel on MyChart.  This should be available in the next 2 to 3 hours.  This will contain results for flu A, flu B, as well as COVID-19. ? ?I prescribed you a cough medicine called Tessalon Perles.  You can take these up to 3 times per day as needed for your cough.  Please take them as prescribed. ? ?I would also recommend purchasing fluticasone.  This can be purchased over-the-counter.  This is also known as Flonase.  Please spray this into each nostril 1-2 times per day.  This will hopefully help with congestion as well as your postnasal drip. ? ?Please continue to monitor your symptoms closely.  If you develop any new or worsening symptoms please come back to the emergency department. ?

## 2022-03-17 NOTE — ED Provider Notes (Signed)
?[facility] ?Provider Note ? ? ?CSN: [id number] ?Arrival date & time: 03/17/22  1445 ? ?  ? ?History ? ?Chief Complaint  ?Patient presents with  ? Cough  ? Sore Throat  ? ? ?Terri Gibson is a 46 y.o. female. ? ?HPI ?Patient is a 46 year old female who presents to the emergency department due to cough, chest congestion, sore throat, bilateral tinnitus for the past week.  She has been taking DayQuil as well as Mucinex with little relief.  Reports chest pain when coughing but denies any at rest.  Denies any shortness of breath, abdominal pain, nausea, vomiting.  She does note intermittent diarrhea but denies any abdominal pain. ?  ? ?Home Medications ?Prior to Admission medications   ?Medication Sig Start Date End Date Taking? Authorizing Provider  ?benzonatate (TESSALON) 100 MG capsule Take 1 capsule (100 mg total) by mouth 3 (three) times daily as needed for cough. 03/17/22  Yes Rayna Sexton, PA-C  ?acetaminophen (TYLENOL) 500 MG tablet Take 500 mg by mouth every 6 (six) hours as needed for moderate pain, fever or headache.    [provider]  ?ondansetron (ZOFRAN-ODT) 4 MG disintegrating tablet Take 1 tablet (4 mg total) by mouth every 8 (eight) hours as needed for nausea or vomiting. 07/14/20   Vanessa Kick, MD  ?pantoprazole (PROTONIX) 40 MG tablet Take 1 tablet (40 mg total) by mouth daily for 10 days. 07/25/20 08/04/20  Terrilee Croak, MD  ?predniSONE (DELTASONE) 10 MG tablet Take 4 tabs twice a day for 2 days, then take 4 tabs daily for 2 days, then take 3 tabs daily for 2 days, then take 2 tabs daily for 2 days, then take 1 tab daily for 2 days, then STOP. 07/25/20   Terrilee Croak, MD  ?   ? ?Allergies    ?Hydrocodone-ibuprofen, Rabbit protein, and Tomato   ? ?Review of Systems   ?Review of Systems  ?Constitutional:  Positive for fatigue. Negative for chills and fever.  ?HENT:  Positive for postnasal drip and sore throat. Negative for congestion and ear pain.    ?Respiratory:  Positive for cough. Negative for shortness of breath.   ?Cardiovascular:  Positive for chest pain (when coughing).  ? ?Physical Exam ?Updated Vital Signs ?BP (!) 143/96 (BP Location: Left Arm)   Pulse 79   Temp 98 ?F (36.7 ?C)   Resp 18   SpO2 100%  ?Physical Exam ?Vitals and nursing note reviewed.  ?Constitutional:   ?   General: She is not in acute distress. ?   Appearance: Normal appearance. She is not ill-appearing, toxic-appearing or diaphoretic.  ?HENT:  ?   Head: Normocephalic and atraumatic.  ?   Right Ear: Tympanic membrane, ear canal and external ear normal. No drainage, swelling or tenderness. No middle ear effusion. Tympanic membrane is not erythematous.  ?   Left Ear: Tympanic membrane, ear canal and external ear normal. No drainage, swelling or tenderness.  No middle ear effusion. Tympanic membrane is not erythematous.  ?   Ears:  ?   Comments: Bilateral ears, EACs, and TMs appear normal. ?   Nose: Nose normal. No congestion or rhinorrhea.  ?   Mouth/Throat:  ?   Mouth: Mucous membranes are moist. No oral lesions.  ?   Pharynx: Oropharynx is clear. No pharyngeal swelling, oropharyngeal exudate, posterior oropharyngeal erythema or uvula swelling.  ?   Tonsils: No tonsillar exudate or tonsillar abscesses. 0 on the right. 0 on the left.  ?  Comments: Uvula midline.  No erythema noted in the posterior oropharynx.  No tonsillar hypertrophy.  Readily handling secretions.  No hot potato voice.  No stridor. ?Eyes:  ?   Extraocular Movements: Extraocular movements intact.  ?Cardiovascular:  ?   Rate and Rhythm: Normal rate and regular rhythm.  ?   Pulses: Normal pulses.  ?   Heart sounds: Normal heart sounds. No murmur heard. ?  No friction rub. No gallop.  ?Pulmonary:  ?   Effort: Pulmonary effort is normal. No respiratory distress.  ?   Breath sounds: Normal breath sounds. No stridor. No wheezing, rhonchi or rales.  ?   Comments: Lungs are clear to auscultation bilaterally.  No wheezing,  rales, or rhonchi. ?Abdominal:  ?   General: Abdomen is flat.  ?   Palpations: Abdomen is soft.  ?   Tenderness: There is no abdominal tenderness.  ?   Comments: Abdomen is soft and nontender.  ?Musculoskeletal:     ?   General: Normal range of motion.  ?   Cervical back: Normal range of motion and neck supple. No tenderness.  ?Skin: ?   General: Skin is warm and dry.  ?Neurological:  ?   General: No focal deficit present.  ?   Mental Status: She is alert and oriented to person, place, and time.  ?Psychiatric:     ?   Mood and Affect: Mood normal.     ?   Behavior: Behavior normal.  ? ?ED Results / Procedures / Treatments   ?Labs ?(all labs ordered are listed, but only abnormal results are displayed) ?Labs Reviewed  ?RESP PANEL BY RT-PCR (FLU A&B, COVID) [id number]  ? ? ?EKG ?None ? ?Radiology ?DG Chest Portable 1 View ? ?Result Date: 03/17/2022 ?CLINICAL DATA:  46 year old female with history of cough. EXAM: PORTABLE CHEST - 1 VIEW COMPARISON:  07/21/2020 FINDINGS: The mediastinal contours are within normal limits. No cardiomegaly. The lungs are clear bilaterally without evidence of focal consolidation, pleural effusion, or pneumothorax. No acute osseous abnormality. IMPRESSION: No acute cardiopulmonary process. Electronically Signed   By: Ruthann Cancer M.D.   On: 03/17/2022 15:52   ? ?Procedures ?Procedures  ? ?Medications Ordered in ED ?Medications - No data to display ? ?ED Course/ Medical Decision Making/ A&P ?  ?                        ?Medical Decision Making ?Amount and/or Complexity of Data Reviewed ?Radiology: ordered. ? ?Risk ?Prescription drug management. ? ?Pt is a 46 y.o. female who presents to the emergency department due to cough, sore throat, diarrhea for the past week. ? ?Labs: ?Respiratory panel is pending. ? ?Imaging: ?Chest x-ray shows "no acute cardiopulmonary process." ? ?I, Rayna Sexton, PA-C, personally reviewed and evaluated these images and lab results as part of my medical  decision-making. ? ?On my exam heart is regular rate and rhythm without murmurs, rubs, or gallops.  Lungs are clear to auscultation bilaterally.  Abdomen is soft and nontender.  Patient afebrile and nontoxic-appearing.  I obtained a chest x-ray which shows "no acute cardiopulmonary process."  Respiratory panel is pending. ? ?Exam and history appears consistent with a viral process.  Will discharge patient on a course of Tessalon Perles for cough.  Also recommended use of fluticasone.  We discussed dosing. ? ?Patient appears stable for discharge at this time and she is agreeable.  She is going to check the results of her respiratory panel on  MyChart.  Discussed return precautions.  Her questions were answered and she was amicable at the time of discharge. ? ?Note: Portions of this report may have been transcribed using voice recognition software. Every effort was made to ensure accuracy; however, inadvertent computerized transcription errors may be present.  ? ?Final Clinical Impression(s) / ED Diagnoses ?Final diagnoses:  ?Viral URI with cough  ? ?Rx / DC Orders ?ED Discharge Orders   ? ?      Ordered  ?  benzonatate (TESSALON) 100 MG capsule  3 times daily PRN       ? 03/17/22 1601  ? ?  ?  ? ?  ? ? ?  ?Rayna Sexton, PA-C ?03/17/22 1606 ? ?  ?Isla Pence, MD ?03/17/22 1609 ? ?

## 2022-05-06 IMAGING — DX DG CHEST 1V PORT
1 series · 1 of 1 positions shown · non-contrast
Comparison: 11/19/2018

CLINICAL DATA: Coronavirus infection.  Shortness of breath.

EXAM:
PORTABLE CHEST 1 VIEW

[chest ap]
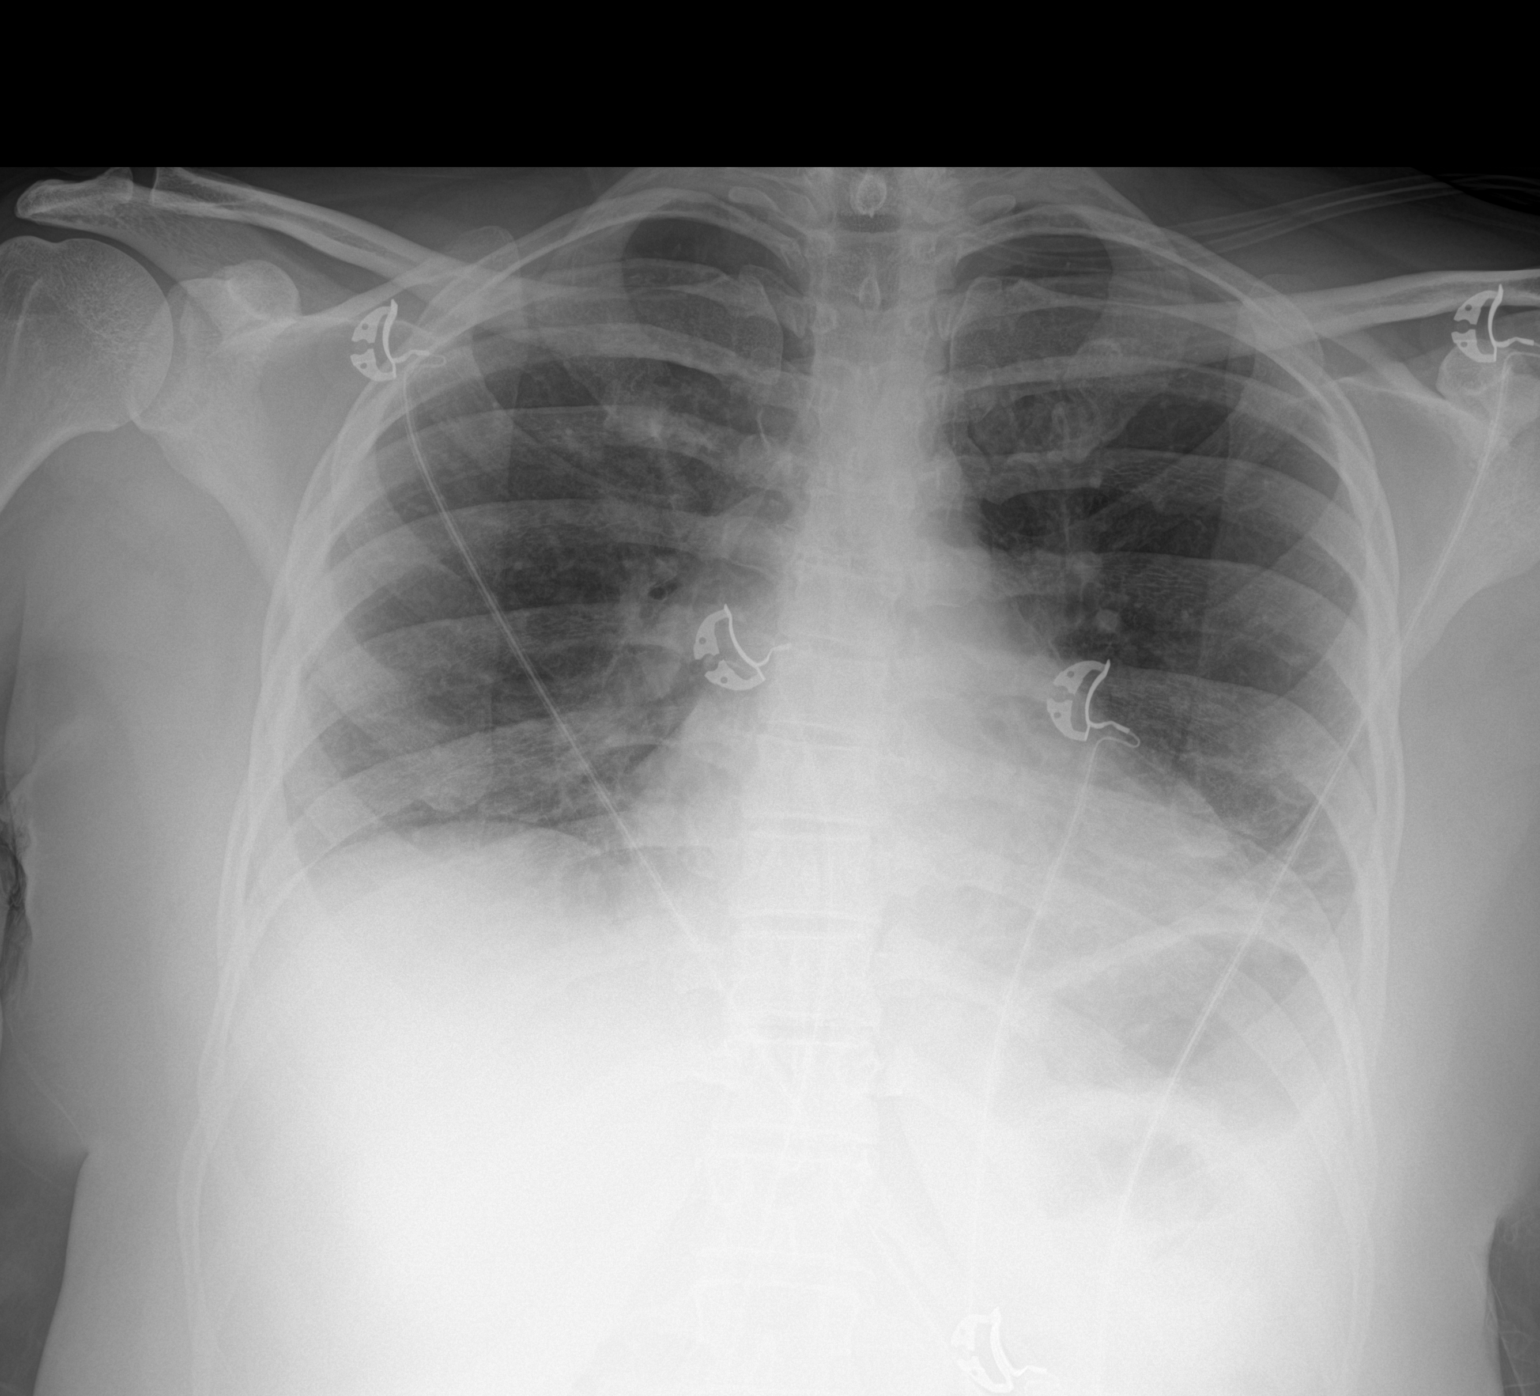

[1 of 1 positions shown; findings below may reference images not displayed]

FINDINGS: Heart size is normal. Hazy infiltrates in the mid and lower lungs
consistent with viral pneumonia. No dense consolidation, collapse or
effusion. No acute bone finding.
IMPRESSION: Hazy infiltrates in the mid and lower lungs consistent with viral
pneumonia.
